# Patient Record
Sex: Female | Born: 1935 | Race: White | Hispanic: No | Smoking: Former smoker
Health system: Southern US, Community
[De-identification: ages and names within clinical notes are randomized; demographics above are authoritative.]

## PROBLEM LIST (undated history)

## (undated) DIAGNOSIS — I639 Cerebral infarction, unspecified: Secondary | ICD-10-CM

## (undated) DIAGNOSIS — I447 Left bundle-branch block, unspecified: Secondary | ICD-10-CM

## (undated) DIAGNOSIS — C801 Malignant (primary) neoplasm, unspecified: Secondary | ICD-10-CM

## (undated) DIAGNOSIS — I10 Essential (primary) hypertension: Secondary | ICD-10-CM

## (undated) DIAGNOSIS — E7800 Pure hypercholesterolemia, unspecified: Secondary | ICD-10-CM

## (undated) DIAGNOSIS — H9313 Tinnitus, bilateral: Secondary | ICD-10-CM

## (undated) DIAGNOSIS — M1990 Unspecified osteoarthritis, unspecified site: Secondary | ICD-10-CM

## (undated) DIAGNOSIS — R059 Cough, unspecified: Secondary | ICD-10-CM

## (undated) DIAGNOSIS — E079 Disorder of thyroid, unspecified: Secondary | ICD-10-CM

## (undated) DIAGNOSIS — H409 Unspecified glaucoma: Secondary | ICD-10-CM

## (undated) DIAGNOSIS — R05 Cough: Secondary | ICD-10-CM

## (undated) HISTORY — PX: BREAST SURGERY: SHX581

## (undated) HISTORY — PX: THYROIDECTOMY: SHX17

## (undated) HISTORY — PX: INTRACAPSULAR CATARACT EXTRACTION: SHX361

## (undated) HISTORY — PX: TUBAL LIGATION: SHX77

## (undated) HISTORY — PX: CATARACT EXTRACTION W/ INTRAOCULAR LENS IMPLANT: SHX1309

## (undated) HISTORY — PX: EYE SURGERY: SHX253

## (undated) HISTORY — PX: BREAST ENHANCEMENT SURGERY: SHX7

## (undated) HISTORY — DX: Disorder of thyroid, unspecified: E07.9

## (undated) HISTORY — DX: Essential (primary) hypertension: I10

## (undated) HISTORY — PX: APPENDECTOMY: SHX54

---
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
## 1968-01-06 HISTORY — PX: ABDOMINAL HYSTERECTOMY: SHX81

## 1997-11-07 ENCOUNTER — Other Ambulatory Visit: Admission: RE | Admit: 1997-11-07 | Discharge: 1997-11-07 | Payer: Self-pay | Admitting: Gynecology

## 2002-03-13 ENCOUNTER — Other Ambulatory Visit: Admission: RE | Admit: 2002-03-13 | Discharge: 2002-03-13 | Payer: Self-pay | Admitting: Gynecology

## 2003-10-04 ENCOUNTER — Other Ambulatory Visit: Admission: RE | Admit: 2003-10-04 | Discharge: 2003-10-04 | Payer: Self-pay | Admitting: Gynecology

## 2004-10-23 ENCOUNTER — Other Ambulatory Visit: Admission: RE | Admit: 2004-10-23 | Discharge: 2004-10-23 | Payer: Self-pay | Admitting: Gynecology

## 2006-10-27 ENCOUNTER — Emergency Department (HOSPITAL_COMMUNITY): Admission: EM | Admit: 2006-10-27 | Discharge: 2006-10-27 | Payer: Self-pay | Admitting: Family Medicine

## 2008-12-25 ENCOUNTER — Ambulatory Visit (HOSPITAL_BASED_OUTPATIENT_CLINIC_OR_DEPARTMENT_OTHER): Admission: RE | Admit: 2008-12-25 | Discharge: 2008-12-25 | Payer: Self-pay | Admitting: Orthopedic Surgery

## 2009-01-05 HISTORY — PX: OTHER SURGICAL HISTORY: SHX169

## 2009-05-27 ENCOUNTER — Encounter: Payer: Self-pay | Admitting: Orthopedic Surgery

## 2009-06-06 ENCOUNTER — Ambulatory Visit (HOSPITAL_COMMUNITY): Admission: RE | Admit: 2009-06-06 | Discharge: 2009-06-06 | Payer: Self-pay | Admitting: Orthopedic Surgery

## 2009-06-06 ENCOUNTER — Ambulatory Visit: Payer: Self-pay | Admitting: Orthopedic Surgery

## 2009-06-06 DIAGNOSIS — M479 Spondylosis, unspecified: Secondary | ICD-10-CM | POA: Insufficient documentation

## 2009-06-06 DIAGNOSIS — M1610 Unilateral primary osteoarthritis, unspecified hip: Secondary | ICD-10-CM | POA: Insufficient documentation

## 2009-06-06 DIAGNOSIS — Q762 Congenital spondylolisthesis: Secondary | ICD-10-CM | POA: Insufficient documentation

## 2010-02-03 ENCOUNTER — Ambulatory Visit: Admit: 2010-02-03 | Payer: Self-pay | Admitting: Vascular Surgery

## 2010-02-03 ENCOUNTER — Ambulatory Visit
Admission: RE | Admit: 2010-02-03 | Discharge: 2010-02-03 | Payer: Self-pay | Source: Home / Self Care | Attending: Vascular Surgery | Admitting: Vascular Surgery

## 2010-02-03 NOTE — Consult Note (Signed)
NEW PATIENT CONSULTATION  Alyssa [name], [name] DOB:  04-17-1927                                       02/03/2010 [id number]#:[id number]  Patient is an 77 year old female patient with a long history of venous insufficiency in both lower extremities, left worse  than the right. She has been having increasing burning, aching, and throbbing discomfort in both legs, left worse than right, over the last several months.  She was prescribed long-leg 30-40 mm elastic compression stockings which have not helped her symptomatology.  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  She has no history of thrombophlebitis, deep vein thrombosis, stasis ulcers or bleeding.  Does have some swelling as the day progresses.  She also has been found to have a limb length discrepancy with the left leg longer than the right.  CHRONIC MEDICAL PROBLEMS: 1. Hypertension. 2. History of thyroid cysts, previous thyroidectomy. 3. Cataracts. 4. Negative coronary artery disease, diabetes, COPD, or stroke.  SOCIAL HISTORY:  She is widowed and has 6 children and has not smoked in 40 years.  Does not use alcohol.  FAMILY HISTORY:  Negative coronary artery disease, diabetes, and stroke.  REVIEW OF SYSTEMS:  Positive for occasional productive cough, arthritis in the left hip, possibly due to her left limb length discrepancy.  All other systems are negative in complete review of systems.  PHYSICAL EXAMINATION:  High blood pressure is 164/82, heart rate 80, respirations 20.  General:  She is a well-developed and well-nourished female in no apparent distress, alert and oriented x3.  HEENT:  Normal for age.  EOMs intact.  Lungs:  Clear to auscultation.  No rhonchi or wheezing.  Cardiovascular:  Regular rhythm.  No murmurs.  Carotid pulses are 3+.  No audible bruits.  Abdomen:  Soft, nontender with no masses. Musculoskeletal:   Reveals the left thigh to be slightly longer than the right on general inspection.  Otherwise no abnormalities are noted. Neurologic:  Normal.  Skin is free of rashes.  Lower extremity exam reveals 3+ femoral, popliteal, and dorsalis pedis pulses palpable bilaterally.  She has bulging varicosities in the left leg beginning in the proximal thigh, extending anteriorly to the thigh, lateral to the knee, down into the pretibial area toward the ankle with diffuse reticular veins.  Right leg has a linear collection of bulging varicosities beginning in the upper third of the thigh, extending around the posterior thigh laterally and lateral to the knee, down into the lateral calf area.  There is no hyperpigmentation or ulceration noted.  Today I ordered lower extremity venous duplex exam which I reviewed and interpreted.  There is no DVT bilaterally.  Left leg has a great saphenous vein which revealed reflux at the junction and down throughout to the knee level.  On the right side, there is reflux at the junction extending down the great saphenous vein out through a lateral branch, which is small.  This patient is having symptomatic venous insufficiency, and I think we will treat her for another 4-6 weeks with long-leg elastic compression stockings (20 mm - 30 mm) which she has already worn for 2 months.  If her symptoms continue when she returns, I think the following procedures should be performed:  1.  A long laser ablation of  the left great saphenous veins, mid to proximal thigh to the junction.  2.  Multiple stab phlebectomies on the right side in the thigh and calf area.  3. Return in 3 months for probable stab phlebectomies of the left leg.  The patient will return in 4-6 weeks to complete her 92-month treatment. Conservative measures.    [name], M.D. Electronically Signed  JDL/MEDQ  D:  02/03/2010  T:  02/03/2010  Job:  [id number]

## 2010-02-06 NOTE — Medication Information (Signed)
Summary: RX Folder  RX Folder   Imported By: Cammie Sickle 06/11/2009 10:12:45  _____________________________________________________________________  External Attachment:    Type:   Image     Comment:   External Document

## 2010-02-06 NOTE — Letter (Signed)
Summary: Xray order  Xray order   Imported By: Cammie Sickle 06/06/2009 09:35:38  _____________________________________________________________________  External Attachment:    Type:   Image     Comment:   External Document

## 2010-02-06 NOTE — Letter (Signed)
Summary: History form  History form   Imported By: Jacklynn Ganong 06/20/2009 16:31:27  _____________________________________________________________________  External Attachment:    Type:   Image     Comment:   External Document

## 2010-02-06 NOTE — Assessment & Plan Note (Signed)
Summary: RT LEG LIMP/NEEDS XRAY/BCBS/CAF   Vital Signs:  Patient profile:   77 year old female Height:      63 inches Weight:      138 pounds Pulse rate:   76 / minute Resp:     16 per minute  Vitals Entered By: Fuller Canada MD (June 06, 2009 8:55 AM)  Visit Type:  new patient Referring Provider:  self Primary Provider:  Dr. Hughie Closs  CC:  limp.  History of Present Illness: I saw Alyssa [name] in the office today for an initial visit.  She is a 77 years old woman with the complaint of:  right leg is shorter than left leg.  SHE STARTED NOTICING THIS ABOUT 15 YEARS AGO. NOW SHE HAS A SIGNIFICANT LIMP. SHE ALSO NOTICED THAT SHE CANT CROSS HER LEGS.     Allergies (verified): 1)  ! 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 Respiratory:  Complains of couch and snoring; denies short of breath, wheezing, tightness, pain on inspiration, and snoring . Gastrointestinal:  Complains of constipation; denies heartburn, nausea, vomiting, diarrhea, and blood in your stools. Neurologic:  Complains of unsteady gait; denies numbness, tingling, dizziness, tremors, and seizure. Musculoskeletal:  Complains of stiffness; denies joint pain, swelling, instability, redness, heat, and muscle pain. Immunology:  Complains of seasonal allergies; denies sinus problems and allergic to bee stings.  The review of systems is negative for Cardiovascular, Genitourinary, Endocrine, Psychiatric, Skin, HEENT, and Hemoatologic.  Physical Exam  Msk:  The patient is well  developed and nourished, with normal grooming and hygiene. The body habitus is  small  Pulses:  The pulses and perfusion were normal with normal color, temperature  and no swelling   Extremities:   The upper extremities have normal appearance, ROM, strength and stability.  normal ROM, STRENGTH, AND STABILITY ALIGNMENT RIGHT LOWER EXTREMITY   LEFT HIP: ONLY 90 DEGREES OF FLEXION, 15 IR AND THERE WAS PAIN WITH IR   LEFT KNEE HELD IN FLEXION; BUT ITS NOT FIXED   MOTOR EXAM NORMAL AND ALIGNMENT IS NORMAL;  NO TENDERNESS AND NO INSTABILTY  Neurologic:  The coordination and sensation were normal  The reflexes were normal   Skin:  intact without lesions or rashes Inguinal Nodes:  no significant adenopathy Psych:  alert and cooperative; normal mood and affect; normal attention span and concentration   Impression & Recommendations:  Problem # 1:  ARTHRITIS, LEFT HIP (ICD-716.95) Assessment New  x-rays were done at [facility] AP lateral LEFT hip shows osteoarthritis LEFT hip  AP pelvis shows that the LEFT leg is about a centimeter longer than the RIGHT at least on the pelvic view.  There is not a significant amount of joint space narrowing there.  In the lumbosacral area there is spondylolisthesis at L4 and 5 and pelvic obliquity consistent with the block evaluation which was done showing a 1 inch leg length discrepancy LEFT longer than RIGHT  Orders: New Patient Level III (96295)  Problem # 2:  SPONDYLOLYSIS (MWU-132.44) Assessment: New  Orders: New Patient Level III (01027)  Problem # 3:  SPONDYLOSIS (ICD-721.90) Assessment: New  Orders: New Patient Level III (16109)  Problem # 4:  SPONDYLOLITHESIS (UEA-540.98) Assessment: New  Orders: New Patient Level III (11914)  Patient Instructions: 1)  3/4 INCH HEEL LIFT RIGHT  2)  Please schedule a follow-up appointment as needed.

## 2010-02-10 NOTE — Procedures (Unsigned)
LOWER EXTREMITY VENOUS REFLUX EXAM  INDICATION:  Evaluate for venous reflux.  EXAM:  Using color-flow imaging and pulse Doppler spectral analysis, the bilateral common femoral, superficial femoral, popliteal, posterior tibial, greater and lesser saphenous veins were evaluated.  There is evidence suggesting deep venous reflux of  >500 milliseconds noted in the bilateral lower extremities.  The bilateral saphenofemoral junctions and non-tortuous greater saphenous veins demonstrate reflux of >500 milliseconds.  The bilateral proximal short saphenous vein demonstrate competency.  GSV Diameter (used if found to be incompetent only)                                           Right    Left Proximal Greater Saphenous Vein           0.24 cm  0.61 cm Proximal-to-mid-thigh                     cm       cm Mid thigh                                 0.24 cm  0.35 cm Mid-distal thigh                          cm       cm Distal thigh                              0.23 cm  0.21 cm Knee                                      0.25 cm  0.20 cm  IMPRESSION: 1. Bilateral greater saphenous vein reflux is noted as described     above. 2. The bilateral deep venous system demonstrates reflux as described     above. 3. The bilateral short saphenous veins are competent.  ___________________________________________ Quita Skye. [name], M.D.  CH/MEDQ  D:  02/03/2010  T:  02/03/2010  Job:  [id number]

## 2010-03-03 ENCOUNTER — Ambulatory Visit: Payer: Self-pay | Admitting: Vascular Surgery

## 2010-03-04 ENCOUNTER — Ambulatory Visit (INDEPENDENT_AMBULATORY_CARE_PROVIDER_SITE_OTHER): Payer: Medicare Other | Admitting: Vascular Surgery

## 2010-03-04 DIAGNOSIS — I83893 Varicose veins of bilateral lower extremities with other complications: Secondary | ICD-10-CM

## 2010-03-05 NOTE — Assessment & Plan Note (Signed)
OFFICE VISIT  Alyssa [name], Alyssa [name] DOB:  1927/04/11                                       03/04/2010 [id number]#:[id number]  The patient  returns today for continued follow-up regarding her venous insufficiency in both lower extremities.  She has been shown to have reflux in the left great saphenous system from the saphenofemoral junction to the mid thigh which is supplying a long string of bulging varicosities along the anterior thigh lateral to the knee and into the lateral pretibial area.  She also has reflux of the lateral of the right great saphenous system which supplies multiple varicosities posteriorly in the thigh and in the right lateral calf.  She  has been wearing long- leg elastic compression stockings (20 mm - 30 mm gradient) as well as trying elevation and ibuprofen but continues to have some symptomatology which she was experiencing previously including burning and hot sensation in the legs , left worse than right, as well as aching and throbbing discomfort as the day progresses.  She has no history of DVT.  PHYSICAL EXAMINATION:  Today blood pressure is 143/75, heart rate 94, respirations 24.  The varicosities are in the distribution as noted above.  She has 2+ to 3+ dorsalis pedis pulse bilaterally.  I visualized her left great saphenous system today with the SonoSite and think she is a good candidate for laser ablation.  I think the best plan would be to: 1. Perform laser ablation of the left great saphenous vein from the     mid thigh to the SFJ. 2. Multiple stab phlebectomies in the right leg. 3. After 3 months consider stab phlebectomies in the left leg.  We will proceed with precertification to treat these symptoms which are affecting her daily living.    [name], M.D. Electronically Signed  JDL/MEDQ  D:  03/04/2010  T:  03/05/2010  Job:  [id number]

## 2010-04-14 ENCOUNTER — Other Ambulatory Visit (INDEPENDENT_AMBULATORY_CARE_PROVIDER_SITE_OTHER): Payer: Medicare Other | Admitting: Vascular Surgery

## 2010-04-14 DIAGNOSIS — I83893 Varicose veins of bilateral lower extremities with other complications: Secondary | ICD-10-CM

## 2010-04-14 HISTORY — PX: ENDOVENOUS ABLATION SAPHENOUS VEIN W/ LASER: SUR449

## 2010-04-15 NOTE — Assessment & Plan Note (Signed)
OFFICE VISIT  [name], [name] DOB:  Feb 22, 1927                                       04/14/2010 [id number]#:[id number]  The patient had laser ablation of her left great saphenous vein for painful varicosities in her left thigh and calf performed today under local tumescent anesthesia.  She tolerated the procedure well.  She will return in 1 week for venous duplex exam to confirm closure of the left great saphenous vein.  She will then be scheduled for multiple stab phlebectomies in the right leg and in 3 months will return for further evaluation to consider stab phlebectomies in the left leg.    [name], M.D. Electronically Signed  JDL/MEDQ  D:  04/14/2010  T:  04/15/2010  Job:  [id number]

## 2010-04-21 ENCOUNTER — Ambulatory Visit (INDEPENDENT_AMBULATORY_CARE_PROVIDER_SITE_OTHER): Payer: Medicare Other | Admitting: Vascular Surgery

## 2010-04-21 ENCOUNTER — Encounter (INDEPENDENT_AMBULATORY_CARE_PROVIDER_SITE_OTHER): Payer: Medicare Other

## 2010-04-21 DIAGNOSIS — I83893 Varicose veins of bilateral lower extremities with other complications: Secondary | ICD-10-CM

## 2010-04-21 DIAGNOSIS — Z48812 Encounter for surgical aftercare following surgery on the circulatory system: Secondary | ICD-10-CM

## 2010-04-21 DIAGNOSIS — R609 Edema, unspecified: Secondary | ICD-10-CM

## 2010-04-21 NOTE — Assessment & Plan Note (Signed)
OFFICE VISIT  [name], [name] DOB:  [birth year]                                       04/21/2010 [id number]#:[id number]  The patient returns 1 week post laser ablation of the left great saphenous vein for painful varicosities in the left posterior thigh and medial calf area.  She tolerated the procedure well 1 week ago and has had minimal discomfort along the course of the ablation site and states that she has already noted less tenseness in the bulging varicosities although they are still quite noticeable.  She has been wearing her long- leg elastic compression stockings (20 - 30 mm) and elevating her legs and taking ibuprofen as instructed.  She continues to have aching and throbbing discomfort in the right thigh and calf.  Today we performed a venous duplex exam of the left leg which reveals total closure of the left great saphenous vein from the mid thigh to the saphenofemoral junction with no DVT.  On exam she continues to have the varicosities in the posterior thigh and lateral calf on the left with 2 to 3+ dorsalis pedis pulse palpable. There is no distal edema and minimal tenderness along the course of the ablated saphenous vein.  On the right side the bulging varicosities are quite tense in the posterior thigh extending down lateral to the knee and into the lateral calf area.  I visualized the right great saphenous vein today and there is reflux at the junction but only a very short segment which communicates with these named varicosities described above.  The next procedure will be multiple stab phlebectomies of the bulging varicosities in the right leg to hopefully relieve her symptoms and then she will return in 3 months for further evaluation to complete treatment of the left leg with stab phlebectomies.    [name], M.D. Electronically Signed  JDL/MEDQ  D:  04/21/2010  T:  04/21/2010  Job:  [id number]

## 2010-04-24 NOTE — Procedures (Unsigned)
DUPLEX DEEP VENOUS EXAM - LOWER EXTREMITY  INDICATION:  Followup exam of the left lower extremity post endovenous laser ablation.  HISTORY:  Edema:  Yes. Trauma/Surgery:  Left EVLA on 04/14/2010. Pain:  Yes. PE:  No. Previous DVT:  No. Anticoagulants:  No. Other:  DUPLEX EXAM:               CFV   SFV   PopV   PTV   GSV               R  L  R  L  R  L   R  L  R  L Thrombosis    o  O     o     o      o     + Spontaneous   +  +     +     +      +     o Phasic        +  +     +     +      +     o Augmentation  +  +     +     +      +     o Compressible  +  +     +     +      +     o Competent     +  +     +     +      +  Legend:  + - yes  o - no  p - partial  D - decreased  IMPRESSION: 1. No evidence of deep venous thrombosis identified involving the left     lower extremity venous system. 2. Good post ablation result from the mid thigh to the saphenofemoral     junction in the left. 3. The remainder of the left greater saphenous vein remains     patent/open, with a large varicosity arising anterolaterally at the     mid thigh segment. 4. The contralateral common femoral vein is patent and without reflux     present.   _____________________________ Quita Skye. [name], M.D.  SH/MEDQ  D:  04/21/2010  T:  04/21/2010  Job:  [id number]

## 2010-06-03 ENCOUNTER — Encounter (INDEPENDENT_AMBULATORY_CARE_PROVIDER_SITE_OTHER): Payer: Medicare Other | Admitting: Vascular Surgery

## 2010-06-03 DIAGNOSIS — I83893 Varicose veins of bilateral lower extremities with other complications: Secondary | ICD-10-CM

## 2010-06-03 HISTORY — PX: OTHER SURGICAL HISTORY: SHX169

## 2010-06-04 NOTE — Assessment & Plan Note (Signed)
OFFICE VISIT  Alyssa [name], Alyssa [name] DOB:  July 30, 1927                                       06/03/2010 [id number]#:[id number]  Patient had greater than 20 stab phlebectomies in the right posterior medial thigh and posterior and lateral calf under local tumescent anesthesia.  She tolerated the procedure well.  She will return in 6-8 weeks for follow-up, at which time we will assess her contralateral extremity for potential stab phlebectomies.    [name], M.D. Electronically Signed  JDL/MEDQ  D:  06/03/2010  T:  06/04/2010  Job:  [id number]

## 2010-06-09 ENCOUNTER — Encounter: Payer: Medicare Other | Admitting: Vascular Surgery

## 2010-07-17 ENCOUNTER — Encounter: Payer: Self-pay | Admitting: Vascular Surgery

## 2010-07-24 ENCOUNTER — Encounter: Payer: Self-pay | Admitting: Vascular Surgery

## 2010-07-28 ENCOUNTER — Ambulatory Visit: Payer: Medicare Other | Admitting: Vascular Surgery

## 2010-07-29 ENCOUNTER — Ambulatory Visit: Payer: Medicare Other | Admitting: Vascular Surgery

## 2010-08-05 ENCOUNTER — Ambulatory Visit (INDEPENDENT_AMBULATORY_CARE_PROVIDER_SITE_OTHER): Payer: Medicare Other | Admitting: Vascular Surgery

## 2010-08-05 VITALS — BP 169/80 | HR 81 | Resp 20 | Ht 62.0 in | Wt 136.0 lb

## 2010-08-05 DIAGNOSIS — I83893 Varicose veins of bilateral lower extremities with other complications: Secondary | ICD-10-CM

## 2010-08-05 NOTE — Progress Notes (Signed)
Subjective:     Patient ID: Alyssa [name], female   DOB: 27-May-1927, 77 y.o.   MRN: [id number]  HPI this patient returns for initial followup regarding her laser ablation of the left great saphenous vein in the proximal thigh or valvular incompetence and venous hypertension. She has bulging varicosities beginning in the mid thigh extending down lateral to the end of the lateral calf area. These are much less tense than they were prior to her laser ablation procedure but continued to be symptomatic with aching throbbing and burning discomfort. This has not resolved even wearing long leg he last ate compression stockings. She has no distal edema but does have significant symptomatology during the day relating to these residual varicosities in the left leg. She has no symptoms of the right leg following her stab phlebectomy procedure the right posterior calf on May 29   Review of Systems she denies chest pain dyspnea on exertion PND orthopnea or other generalized symptomatology.     Objective:   Physical Exam on physical exam today blood pressure 169/80 heart rate 81 respirations 20 Gen. he is well-developed well-nourished female no apparent distress alert and oriented x3. Chest clear to auscultation no rhonchi or wheezing Lower extremity exam reveals 3+ femoral popliteal and dorsalis pedis pulses palpable bilaterally. There are bulging varicosities in the left leg beginning in mid anterior thigh extending lateral to the knee and down almost to the left ankle. There is no hyperpigmentation or active ulceration. She also has scattered reticular and spider veins.    Assessment:    doing well post laser ablation of the left great saphenous vein but does have residual symptomatic bulging varicosities in the left leg.    Plan:    patient needs stab phlebectomy is (greater than 20 and apprentices in the left leg of her secondary varicosities. Will proceed with precertification to perform this in the  near future

## 2010-08-05 NOTE — Progress Notes (Signed)
Three month fu after laser for stab phlebs. Of left leg.

## 2010-08-14 ENCOUNTER — Encounter: Payer: Self-pay | Admitting: Vascular Surgery

## 2010-08-25 ENCOUNTER — Encounter: Payer: Self-pay | Admitting: Vascular Surgery

## 2010-08-25 ENCOUNTER — Ambulatory Visit (INDEPENDENT_AMBULATORY_CARE_PROVIDER_SITE_OTHER): Payer: Medicare Other | Admitting: Vascular Surgery

## 2010-08-25 VITALS — BP 176/74 | HR 79 | Resp 18 | Ht 62.0 in | Wt 145.0 lb

## 2010-08-25 DIAGNOSIS — I83893 Varicose veins of bilateral lower extremities with other complications: Secondary | ICD-10-CM

## 2010-08-26 ENCOUNTER — Telehealth: Payer: Self-pay | Admitting: *Deleted

## 2010-08-26 NOTE — Assessment & Plan Note (Signed)
OFFICE VISIT  [name], [name] DOB:  March 21, 1927                                       08/25/2010 [id number]#:[id number]  The patient had greater than 20 stab phlebectomies of her left anterior thigh, lateral thigh and lateral calf area.  She had previously undergone laser ablation of her left great saphenous vein in April of this year for valvular incompetence and reflux.  The procedure today was under local tumescent anesthesia and she tolerated it well.  She will return in 2 months for final followup.    [name], M.D. Electronically Signed  JDL/MEDQ  D:  08/25/2010  T:  08/26/2010  Job:  [id number]

## 2010-08-26 NOTE — Telephone Encounter (Signed)
Pt. Doing well post stab phleb. procedure yesterday. No probs. Plans to return to work tomorrow. Following all instructions that were given.

## 2010-10-27 ENCOUNTER — Encounter: Payer: Self-pay | Admitting: Vascular Surgery

## 2010-10-27 ENCOUNTER — Ambulatory Visit (INDEPENDENT_AMBULATORY_CARE_PROVIDER_SITE_OTHER): Payer: Medicare Other | Admitting: Vascular Surgery

## 2010-10-27 VITALS — BP 140/68 | HR 79 | Resp 16 | Ht 62.0 in | Wt 135.4 lb

## 2010-10-27 DIAGNOSIS — I83893 Varicose veins of bilateral lower extremities with other complications: Secondary | ICD-10-CM

## 2010-10-27 NOTE — Progress Notes (Signed)
Subjective:     Patient ID: Alyssa [name], female   DOB: Oct 10, 1927, 77 y.o.   MRN: [id number]  HPI this 77 year old female returns for final followup regarding her venous treatment. She had laser ablation of the left great saphenous vein performed on 04/14/2010 appear she also had stab phlebectomy performed right lower extremity 06/03/2010 and left lower extremity on 08/25/2010. She has done well following both procedures. She has no distal edema. She has had no aching or throbbing discomfort since her most recent procedures. She no longer needs compression stockings.  Review of Systems     Objective:   Physical Exam blood pressure 140/68 heart rate 79 respirations 16 Lower extremity exam reveals 3+ femoral and dorsalis pedis pulses palpable bilaterally. She has notable varicosities remaining in either lower extremity. All stab phlebectomy wounds have healed nicely. There is no distal edema. She has diffuse spider and reticular veins in the anterolateral thighs bilaterally.     Assessment:    doing well post stab phlebectomy both lower extremities now asymptomatic    Plan:    return on when necessary basis

## 2011-03-23 ENCOUNTER — Emergency Department (HOSPITAL_COMMUNITY): Payer: Medicare Other

## 2011-03-23 ENCOUNTER — Other Ambulatory Visit: Payer: Self-pay

## 2011-03-23 ENCOUNTER — Observation Stay (HOSPITAL_COMMUNITY)
Admission: EM | Admit: 2011-03-23 | Discharge: 2011-03-24 | Disposition: A | Discharge status: Home / Self Care | Payer: Medicare Other | Attending: Internal Medicine | Admitting: Internal Medicine

## 2011-03-23 ENCOUNTER — Encounter (HOSPITAL_COMMUNITY): Payer: Self-pay

## 2011-03-23 DIAGNOSIS — M25519 Pain in unspecified shoulder: Principal | ICD-10-CM | POA: Insufficient documentation

## 2011-03-23 DIAGNOSIS — I639 Cerebral infarction, unspecified: Secondary | ICD-10-CM

## 2011-03-23 DIAGNOSIS — I10 Essential (primary) hypertension: Secondary | ICD-10-CM | POA: Diagnosis present

## 2011-03-23 DIAGNOSIS — R0789 Other chest pain: Secondary | ICD-10-CM | POA: Insufficient documentation

## 2011-03-23 DIAGNOSIS — E785 Hyperlipidemia, unspecified: Secondary | ICD-10-CM | POA: Insufficient documentation

## 2011-03-23 DIAGNOSIS — R079 Chest pain, unspecified: Secondary | ICD-10-CM | POA: Diagnosis present

## 2011-03-23 DIAGNOSIS — M25512 Pain in left shoulder: Secondary | ICD-10-CM | POA: Diagnosis present

## 2011-03-23 HISTORY — DX: Left bundle-branch block, unspecified: I44.7

## 2011-03-23 HISTORY — DX: Pure hypercholesterolemia, unspecified: E78.00

## 2011-03-23 HISTORY — DX: Unspecified osteoarthritis, unspecified site: M19.90

## 2011-03-23 HISTORY — DX: Malignant (primary) neoplasm, unspecified: C80.1

## 2011-03-23 HISTORY — DX: Cerebral infarction, unspecified: I63.9

## 2011-03-23 HISTORY — DX: Unspecified glaucoma: H40.9

## 2011-03-23 LAB — CREATININE, SERUM
Creatinine, Ser: 0.66 mg/dL (ref 0.50–1.10)
GFR calc Af Amer: >90 mL/min (ref >90)

## 2011-03-23 LAB — DIFFERENTIAL
Basophils Relative: 1 % (ref 0–1)
Monocytes Absolute: 0.7 10*3/uL (ref 0.1–1.0)
Neutro Abs: 5.5 10*3/uL (ref 1.7–7.7)

## 2011-03-23 LAB — CBC
Hemoglobin: 12.3 g/dL (ref 12.0–15.0)
MCH: 27.3 pg (ref 26.0–34.0)
MCH: 27.6 pg (ref 26.0–34.0)
Platelets: 223 10*3/uL (ref 150–400)
Platelets: 242 10*3/uL (ref 150–400)
RBC: 4.51 MIL/uL (ref 3.87–5.11)
RBC: 4.63 MIL/uL (ref 3.87–5.11)
WBC: 9 10*3/uL (ref 4.0–10.5)

## 2011-03-23 LAB — TSH: TSH: 2.292 u[IU]/mL (ref 0.350–4.500)

## 2011-03-23 LAB — POCT I-STAT, CHEM 8
Calcium, Ion: 1.12 mmol/L (ref 1.12–1.32)
Chloride: 100 mEq/L (ref 96–112)
Glucose, Bld: 101 mg/dL — ABNORMAL HIGH (ref 70–99)
HCT: 38 % (ref 36.0–46.0)
Hemoglobin: 12.9 g/dL (ref 12.0–15.0)
Potassium: 4.1 mEq/L (ref 3.5–5.1)
TCO2: 25 mmol/L (ref 0–100)

## 2011-03-23 LAB — TROPONIN I: Troponin I: <0.3 ng/mL (ref <0.30)

## 2011-03-23 LAB — URINE MICROSCOPIC-ADD ON

## 2011-03-23 LAB — URINALYSIS, ROUTINE W REFLEX MICROSCOPIC
Glucose, UA: NEGATIVE mg/dL
Nitrite: NEGATIVE
Protein, ur: NEGATIVE mg/dL
Specific Gravity, Urine: 1.004 — ABNORMAL LOW (ref 1.005–1.030)
Urobilinogen, UA: 0.2 mg/dL (ref 0.0–1.0)
pH: 7.5 (ref 5.0–8.0)

## 2011-03-23 LAB — CARDIAC PANEL(CRET KIN+CKTOT+MB+TROPI): Total CK: 82 U/L (ref 7–177)

## 2011-03-23 MED ORDER — ASPIRIN EC 81 MG PO TBEC: 81.0000 mg | DELAYED_RELEASE_TABLET | Freq: Every day | ORAL | Status: DC

## 2011-03-23 MED ORDER — ONDANSETRON HCL 4 MG PO TABS: 4.0000 mg | ORAL_TABLET | Freq: Four times a day (QID) | ORAL | Status: DC | PRN

## 2011-03-23 MED ORDER — ACETAMINOPHEN 325 MG PO TABS: 650.0000 mg | ORAL_TABLET | Freq: Four times a day (QID) | ORAL | Status: DC | PRN

## 2011-03-23 MED ORDER — ONDANSETRON HCL 4 MG/2ML IJ SOLN: 4.0000 mg | Freq: Four times a day (QID) | INTRAMUSCULAR | Status: DC | PRN

## 2011-03-23 MED ORDER — DOCUSATE SODIUM 100 MG PO CAPS
100.0000 mg | ORAL_CAPSULE | Freq: Two times a day (BID) | ORAL | Status: DC
  Administered 2011-03-23 – 2011-03-24 (×2): 100 mg via ORAL
  Filled 2011-03-23 (×3): qty 1

## 2011-03-23 MED ORDER — IRBESARTAN 75 MG PO TABS: 75.0000 mg | ORAL_TABLET | Freq: Every day | ORAL | Status: DC

## 2011-03-23 MED ORDER — ASPIRIN EC 81 MG PO TBEC
81.0000 mg | DELAYED_RELEASE_TABLET | Freq: Every day | ORAL | Status: DC
  Administered 2011-03-24: 81 mg via ORAL
  Filled 2011-03-23: qty 1

## 2011-03-23 MED ORDER — ACETAMINOPHEN 650 MG RE SUPP: 650.0000 mg | Freq: Four times a day (QID) | RECTAL | Status: DC | PRN

## 2011-03-23 MED ORDER — SODIUM CHLORIDE 0.9 % IJ SOLN: 3.0000 mL | Freq: Two times a day (BID) | INTRAMUSCULAR | Status: DC

## 2011-03-23 MED ORDER — SODIUM CHLORIDE 0.9 % IV SOLN
INTRAVENOUS | Status: DC
  Administered 2011-03-23 (×2): via INTRAVENOUS

## 2011-03-23 MED ORDER — IRBESARTAN 75 MG PO TABS
75.0000 mg | ORAL_TABLET | Freq: Every day | ORAL | Status: DC
  Administered 2011-03-24: 75 mg via ORAL
  Filled 2011-03-23: qty 1

## 2011-03-23 MED ORDER — ALUM & MAG HYDROXIDE-SIMETH 200-200-20 MG/5ML PO SUSP: 30.0000 mL | Freq: Four times a day (QID) | ORAL | Status: DC | PRN

## 2011-03-23 MED ORDER — ENOXAPARIN SODIUM 40 MG/0.4ML ~~LOC~~ SOLN: 40.0000 mg | SUBCUTANEOUS | Status: DC

## 2011-03-23 MED ORDER — ENOXAPARIN SODIUM 40 MG/0.4ML ~~LOC~~ SOLN
40.0000 mg | SUBCUTANEOUS | Status: DC
  Filled 2011-03-23 (×2): qty 0.4

## 2011-03-23 MED ORDER — SODIUM CHLORIDE 0.9 % IJ SOLN: 3.0000 mL | INTRAMUSCULAR | Status: DC | PRN

## 2011-03-23 MED ORDER — SODIUM CHLORIDE 0.9 % IV SOLN: 250.0000 mL | INTRAVENOUS | Status: DC | PRN

## 2011-03-23 MED ORDER — POLYETHYLENE GLYCOL 3350 17 G PO PACK
17.0000 g | PACK | Freq: Every day | ORAL | Status: DC | PRN
  Filled 2011-03-23: qty 1

## 2011-03-23 MED ORDER — HYDROCODONE-ACETAMINOPHEN 5-325 MG PO TABS: 1.0000 | ORAL_TABLET | ORAL | Status: DC | PRN

## 2011-03-23 MED ORDER — ZOLPIDEM TARTRATE 5 MG PO TABS: 5.0000 mg | ORAL_TABLET | Freq: Every evening | ORAL | Status: DC | PRN

## 2011-03-23 MED ORDER — SODIUM CHLORIDE 0.9 % IJ SOLN
3.0000 mL | Freq: Two times a day (BID) | INTRAMUSCULAR | Status: DC
  Administered 2011-03-23 – 2011-03-24 (×2): 3 mL via INTRAVENOUS

## 2011-03-23 NOTE — H&P (Signed)
Patient seen and examined. Vague left upper chest pain and left upper arm pain that completely resolved. Apparently BP check at home was systolic 200, therefore presented to the ED for further evaluation. Given elevated BP and advanced age-patient will be brought is as overnight observation and enzymes will be cycled. Will monitor and follow clinical course. Agree with assessment and plan as outlined by Ms [place].

## 2011-03-23 NOTE — ED Notes (Signed)
MD at bedside. 

## 2011-03-23 NOTE — ED Provider Notes (Signed)
History     CSN: [id number]  Arrival date & time 03/23/11  1031   First MD Initiated Contact with Patient 03/23/11 1102      Chief Complaint  Patient presents with  . Chest Pain    (Consider location/radiation/quality/duration/timing/severity/associated sxs/prior treatment) Patient is a 78 y.o. female presenting with chest pain. The history is provided by the patient.  Chest Pain Pertinent negatives for primary symptoms include no shortness of breath, no abdominal pain, no nausea and no vomiting.  Pertinent negatives for associated symptoms include no numbness and no weakness.    patient states that she had left shoulder pain going on or last night. She's had no chest pain. He states he was off the shoulder more last night. She states she's checked her blood pressure is high. She took a couple her blood pressure pills it came down. She states that after aspirin and a blood pressure pill she felt better. She did not do anything physically with her first ER. The pain was not worse with moving the arm. No trauma. No shortness of breath. No cough. She has a history of hypertension.  Past Medical History  Diagnosis Date  . Cataract   . Thyroid disease   . Hypertension     Past Surgical History  Procedure Date  . Appendectomy   . Eye surgery   . Abdominal hysterectomy   . Laser ablation l gsv 04/14/10  . Stab phlebectomies  06/03/10    R leg    History reviewed. No pertinent family history.  History  Substance Use Topics  . Smoking status: Former Smoker    Quit date: 01/05/1970  . Smokeless tobacco: Never Used  . Alcohol Use: No    OB History    Grav Para Term Preterm Abortions TAB SAB Ect Mult Living                  Review of Systems  Constitutional: Negative for activity change and appetite change.  HENT: Negative for neck stiffness.   Eyes: Negative for pain.  Respiratory: Negative for chest tightness and shortness of breath.   Cardiovascular: Negative for chest  pain and leg swelling.       Left shoulder pain  Gastrointestinal: Negative for nausea, vomiting, abdominal pain and diarrhea.  Genitourinary: Negative for flank pain.  Musculoskeletal: Negative for back pain.  Skin: Negative for rash.  Neurological: Negative for weakness, numbness and headaches.  Psychiatric/Behavioral: Negative for behavioral problems.    Allergies  Penicillins  Home Medications  No current outpatient prescriptions on file.  BP 119/70  Pulse 84  Temp(Src) 97.5 F (36.4 C) (Oral)  Resp 19  SpO2 96%  Physical Exam  Nursing note and vitals reviewed. Constitutional: She is oriented to person, place, and time. She appears well-developed and well-nourished.  HENT:  Head: Normocephalic and atraumatic.  Eyes: EOM are normal. Pupils are equal, round, and reactive to light.  Neck: Normal range of motion. Neck supple.  Cardiovascular: Normal rate, regular rhythm and normal heart sounds.   No murmur heard. Pulmonary/Chest: Effort normal and breath sounds normal. No respiratory distress. She has no wheezes. She has no rales.  Abdominal: Soft. Bowel sounds are normal. She exhibits no distension. There is no tenderness. There is no rebound and no guarding.  Musculoskeletal: Normal range of motion.       No tenderness with palpation of the left shoulder. Range of motion intact left shoulder without pain.  Neurological: She is alert and oriented to  person, place, and time. No cranial nerve deficit.  Skin: Skin is warm and dry.  Psychiatric: She has a normal mood and affect. Her speech is normal.    ED Course  Procedures (including critical care time)  Labs Reviewed  URINALYSIS, ROUTINE W REFLEX MICROSCOPIC - Abnormal; Notable for the following:    Specific Gravity, Urine 1.004 (*)    Hgb urine dipstick TRACE (*)    All other components within normal limits  POCT I-STAT, CHEM 8 - Abnormal; Notable for the following:    Glucose, Bld 101 (*)    All other components  within normal limits  CBC  DIFFERENTIAL  TROPONIN I  URINE MICROSCOPIC-ADD ON   Dg Chest 2 View  03/23/2011  *RADIOLOGY REPORT*  Clinical Data: Chest pain  CHEST - 2 VIEW  Comparison: None.  Findings: Hyperinflation.  Mild osteopenia.  Bilateral breast implants. Midline trachea.  Normal heart size and mediastinal contours for age.  No pleural effusion or pneumothorax.  Biapical pleural thickening.  Mild interstitial thickening, lower lobe predominant.  Nonspecific.  Probable mild scarring at the left lung base and possibly the right midlung (versus overlying implants artifact/calcification).  IMPRESSION: Mild hyperinflation, without acute disease.  Original Report Authenticated By: Consuello Bossier, M.D.     1. Shoulder pain      Date: 03/23/2011  Rate: 111  Rhythm: sinus tachycardia  QRS Axis: left  Intervals: normal  ST/T Wave abnormalities: normal  Conduction Disutrbances:left bundle branch block  Narrative Interpretation: More tachycardic than previous.  Old EKG Reviewed: changes noted    MDM  Patient with left shoulder and upper extremity pain. No trauma. Not worse with movement. She states it began while blood pressure was high. EKG is stable except for mild tachycardia. She'll need to be admitted for cardiac rule out        Juliet Rude. Rubin Payor, MD 03/23/11 1419

## 2011-03-23 NOTE — ED Notes (Signed)
Pt c/o left shoulder and arm pain last night and beginning again this am. Denies cp at this time but states that it was tight on left upper side last night. States she had checked her BP and it was high and she took a BP pill and felt better. Pt then states she felt it again this am and her arm and shoulder hurt again and saw BP was high again, took an asa and a BP pill and is feeling better now.

## 2011-03-23 NOTE — ED Notes (Signed)
Consulting MD at bedside

## 2011-03-23 NOTE — H&P (Signed)
Alyssa Melena, MD, MD   Chief Complaint:  Left Shoulder Tingling.  Chest Pain rule out.  HPI: Alyssa [name] is a very active healthy 78 year old female who takes no medications at home and has very little past medical history.  She was her chair at watching TV when she developed left shoulder tingling that extended down to her elbow. She became concerned and called her daughter on the telephone. Her daughter was concern for possible heart attack and brought her mother to the emergency department. In the meantime her mother had taken one aspirin and 1 Benicar. She did not take the Benicar regularly but had obtained samples from her primary care physician 3 years ago. By the time Alyssa [name] was in the emergency department her left shoulder tingling/pain had completely resolved. Alyssa [name] has noticed no shortness of breath on exertion, no orthopnea or PND. She has had no chest pain and has had no left shoulder tingling previously. She is normally very active, eats very well and has had no recent illness.   Review of Systems:  The patient denies anorexia, fever, weight loss,, vision loss, decreased hearing, hoarseness, chest pain, syncope, dyspnea on exertion, peripheral edema, balance deficits, hemoptysis, abdominal pain, [name], hematochezia, severe indigestion/heartburn, hematuria, incontinence, genital sores, muscle weakness, suspicious skin lesions, transient blindness, difficulty walking, depression, unusual weight change, abnormal bleeding, enlarged lymph nodes, angioedema, and breast masses.  Past Medical History: Past Medical History  Diagnosis Date  . Cataract   . Thyroid disease   . Hypertension    Past Surgical History  Procedure Date  . Appendectomy   . Eye surgery   . Abdominal hysterectomy   . Laser ablation l gsv 04/14/10  . Stab phlebectomies  06/03/10    R leg  * Breast Implants    Medications: Prior to Admission medications   Not on File    Allergies:   Allergies    Allergen Reactions  . Penicillins Other (See Comments)    Swelling     Social History:  reports that she quit smoking about 41 years ago. She has never used smokeless tobacco. She reports that she does not drink alcohol. Her drug history not on file.  History   Social History Narrative  . No narrative on file     Family History: Mother passed at age 57.  Phys and the rest of his ical Exam: Filed Vitals:   03/23/11 1330 03/23/11 1400 03/23/11 1430 03/23/11 1500  BP: 123/66 116/68 164/78 129/71  Pulse: 78 82 86 82  Temp:      TempSrc:      Resp: 19 24 20 17   SpO2: 95% 94% 94% 95%   Physical exam Gen.: Patient is alert and oriented she's in no apparent distress she is very pleasant appears stated age Head: is atraumatic normocephalic, eyes are anicteric with pupils that are equal round reactive to light  Neck: She has no carotid bruits on auscultation, no palpable lymph nodes Chest: Clear to auscultation with no wheezes crackles or rales Cardiovascular: Patient is slightly tachycardic with a regular rhythm no murmurs rubs or gallops Abdomen: Soft, nontender, nondistended, with good bowel sounds. Extremities: Show no clubbing, cyanosis, or edema. Peripheral pulses are intact. Left shoulder is nontender to palpation, nor is it tender with full range of motion .    Labs on Admission:   [facility] 03/23/11 1123  NA 137  K 4.1  CL 100  CO2 --  GLUCOSE 101*  BUN 13  CREATININE 0.60  CALCIUM --  MG --  PHOS --    Basename 03/23/11 1123 03/23/11 1110  WBC -- 7.9  NEUTROABS -- 5.5  HGB 12.9 12.3  HCT 38.0 37.0  MCV -- 82.0  PLT -- 223    Basename 03/23/11 1111  CKTOTAL --  CKMB --  CKMBINDEX --  TROPONINI <0.30    Radiological Exams on Admission: Dg Chest 2 View  03/23/2011  *RADIOLOGY REPORT*  Clinical Data: Chest pain  CHEST - 2 VIEW  Comparison: None.  Findings: Hyperinflation.  Mild osteopenia.  Bilateral breast implants. Midline trachea.  Normal heart  size and mediastinal contours for age.  No pleural effusion or pneumothorax.  Biapical pleural thickening.  Mild interstitial thickening, lower lobe predominant.  Nonspecific.  Probable mild scarring at the left lung base and possibly the right midlung (versus overlying implants artifact/calcification).  IMPRESSION: Mild hyperinflation, without acute disease.  Original Report Authenticated By: Consuello Bossier, M.D.    Assessment/Plan Present on Admission:  .Left shoulder pain .Chest pain at rest .Hypertension   1.  Left shoulder tingling - resolved.  Etiology uncertain.  Does not appear to be musculo-skeletal.  First set of cardiac enzymes is negative.  EKG shows LBBB that was on Dec 2010 EKG.  We will admit for observation and rule out ACS with cardiac enzymes X 3  2.  Chest pain at rest - will check 2D echo, lipid panel, TSH, start 81 mg aspirin  3.  Hypertension - monitor in house and start low dose Benicar (10 mg q day)  Stephani Police 03/23/2011, 3:05 PM

## 2011-03-23 NOTE — ED Notes (Signed)
Pt transported to xray 

## 2011-03-23 NOTE — ED Notes (Signed)
Here for left arm pain and chest pain and htn.

## 2011-03-24 LAB — CARDIAC PANEL(CRET KIN+CKTOT+MB+TROPI)
CK, MB: 3.2 ng/mL (ref 0.3–4.0)
Relative Index: INVALID (ref 0.0–2.5)
Total CK: 72 U/L (ref 7–177)
Total CK: 73 U/L (ref 7–177)

## 2011-03-24 LAB — HEMOGLOBIN A1C: Mean Plasma Glucose: 120 mg/dL — ABNORMAL HIGH (ref <117)

## 2011-03-24 LAB — LIPID PANEL: HDL: 45 mg/dL (ref >39)

## 2011-03-24 LAB — TSH: TSH: 3.178 u[IU]/mL (ref 0.350–4.500)

## 2011-03-24 MED ORDER — SIMVASTATIN 10 MG PO TABS
10.0000 mg | ORAL_TABLET | Freq: Every day | ORAL | Status: DC
  Administered 2011-03-24: 10 mg via ORAL
  Filled 2011-03-24: qty 1

## 2011-03-24 MED ORDER — SIMVASTATIN 20 MG PO TABS
20.0000 mg | ORAL_TABLET | Freq: Every day | ORAL | Status: DC
  Filled 2011-03-24: qty 1

## 2011-03-24 MED ORDER — ASPIRIN 81 MG PO TBEC: 81.0000 mg | DELAYED_RELEASE_TABLET | Freq: Every day | ORAL | Status: AC

## 2011-03-24 MED ORDER — SIMVASTATIN 10 MG PO TABS: 10.0000 mg | ORAL_TABLET | Freq: Every day | ORAL | Status: AC

## 2011-03-24 MED ORDER — ACETAMINOPHEN 500 MG PO TABS: 500.0000 mg | ORAL_TABLET | Freq: Four times a day (QID) | ORAL | Status: AC | PRN

## 2011-03-24 NOTE — Discharge Summary (Signed)
Physician Discharge Summary  Patient ID: Alyssa [name] MRN: [id number] DOB/AGE: October 05, 1927 78 y.o.  Admit date: 03/23/2011 Discharge date: 03/24/2011  Primary Care Physician:  Dwana Melena, MD, MD  Discharge Diagnoses:    .Left shoulder pain .Chest pain  Atypical  .Hypertension Hyperlipidemia  Consults:  None  Discharge Medications: Medication List  As of 03/24/2011  4:49 PM   TAKE these medications         acetaminophen 500 MG tablet   Commonly known as: TYLENOL   Take 1 tablet (500 mg total) by mouth every 6 (six) hours as needed for pain.      aspirin 81 MG EC tablet   Take 1 tablet (81 mg total) by mouth daily.      simvastatin 10 MG tablet   Commonly known as: ZOCOR   Take 1 tablet (10 mg total) by mouth daily.             Brief H and P: For complete details please refer to admission H and P, but in brief, patient is a 78 year old female with no significant past medical history, was in her chair watching TV when she dropped left shoulder tingling that extended down to her elbow. Patient called her daughter who was concerned about possible heart attack and brought her mother to the ED. By the time patient was in the ED her left shoulder tingling/pain had completely resolved. Patient was admitted overnight for observation and rule out ACS.  Hospital Course:  Active Problems:  Left shoulder pain/ Chest pain: Patient was admitted to telemetry floor, serial cardiac enzymes were obtained which were negative for any ACS. Patient was started on aspirin. Lipid panel was obtained, which showed elevated cholesterol at 262 and LDL 190. Patient was started on low dose simvastatin. 2-D echocardiogram was obtained which showed EF of 55-60% with grade 1 diastolic dysfunction. Patient was discharged home in stable condition.    Hypertension: The patient had been taking off and on Benicar, however SBP was in 90s during hospitalization, patient was not discharged on any  antihypertensives   Day of Discharge BP 93/58  Pulse 81  Temp(Src) 98.4 F (36.9 C) (Oral)  Resp 18  Ht 5\' 2"  (1.575 m)  Wt 61.508 kg (135 lb 9.6 oz)  BMI 24.80 kg/m2  SpO2 95%  Physical Exam: General: Alert and awake oriented x3 not in any acute distress. HEENT: anicteric sclera, pupils reactive to light and accommodation CVS: S1-S2 clear no murmur rubs or gallops Chest: clear to auscultation bilaterally, no wheezing rales or rhonchi Abdomen: soft nontender, nondistended, normal bowel sounds, no organomegaly Extremities: no cyanosis, clubbing or edema noted bilaterally Neuro: Cranial nerves II-XII intact, no focal neurological deficits   The results of significant diagnostics from this hospitalization (including imaging, microbiology, ancillary and laboratory) are listed below for reference.    LAB RESULTS: Basic Metabolic Panel:  Lab 03/23/11 4098 03/23/11 1123  NA -- 137  K -- 4.1  CL -- 100  CO2 -- --  GLUCOSE -- 101*  BUN -- 13  CREATININE 0.66 0.60  CALCIUM -- --  MG -- --  PHOS -- --   CBC:  Lab 03/23/11 1718 03/23/11 1123 03/23/11 1110  WBC 9.0 -- 7.9  NEUTROABS -- -- 5.5  HGB 12.8 12.9 --  HCT 38.3 38.0 --  MCV 82.7 -- --  PLT 242 -- 223   Cardiac Enzymes:  Lab 03/24/11 0845 03/24/11 0017  CKTOTAL 73 72  CKMB 3.2 3.2  CKMBINDEX -- --  TROPONINI <0.30 <0.30    Significant Diagnostic Studies:  Dg Chest 2 View  03/23/2011  *RADIOLOGY REPORT*  Clinical Data: Chest pain  CHEST - 2 VIEW  Comparison: None.  Findings: Hyperinflation.  Mild osteopenia.  Bilateral breast implants. Midline trachea.  Normal heart size and mediastinal contours for age.  No pleural effusion or pneumothorax.  Biapical pleural thickening.  Mild interstitial thickening, lower lobe predominant.  Nonspecific.  Probable mild scarring at the left lung base and possibly the right midlung (versus overlying implants artifact/calcification).  IMPRESSION: Mild hyperinflation, without  acute disease.  Original Report Authenticated By: Consuello Bossier, M.D.     Disposition and Follow-up: Discharge Orders    Future Orders Please Complete By Expires   Diet - low sodium heart healthy      Increase activity slowly          DISPOSITION: Home  DIET: Heart healthy  ACTIVITY: As tolerated  DISCHARGE FOLLOW-UP Follow-up Information    Follow up with [facility], MD. Schedule an appointment as soon as possible for a visit in 2 weeks. (for hospital follow up)          Time spent on Discharge: 45 minutes  Signed:  Nimisha Rathel M.D. [facility] 03/24/2011, 4:49 PM

## 2011-03-24 NOTE — Progress Notes (Signed)
Pt was given d/c instructions and verbalized understanding. Pt handed prescriptions and educated on how to take meds. No questions at this time. VSS. No complaints. Will monitor until patient leaves unit. Lanae Boast, RN

## 2011-03-24 NOTE — Progress Notes (Signed)
*  PRELIMINARY RESULTS* Echocardiogram 2D Echocardiogram has been performed.  Alyssa [name] [facility] 03/24/2011, 10:52 AM

## 2011-03-25 MED FILL — Perflutren Lipid Microsphere IV Susp 6.52 MG/ML: INTRAVENOUS | Qty: 2 | Status: AC

## 2012-03-04 ENCOUNTER — Other Ambulatory Visit: Payer: Self-pay | Admitting: Orthopedic Surgery

## 2012-03-04 MED ORDER — BUPIVACAINE LIPOSOME 1.3 % IJ SUSP: 20.0000 mL | Freq: Once | INTRAMUSCULAR | Status: DC

## 2012-03-04 MED ORDER — DEXAMETHASONE SODIUM PHOSPHATE 10 MG/ML IJ SOLN: 10.0000 mg | Freq: Once | INTRAMUSCULAR | Status: DC

## 2012-03-04 NOTE — Progress Notes (Signed)
Preoperative surgical orders have been place into the Epic hospital system for Alyssa [name] on 03/04/2012, 2:14 PM  by Patrica Duel for surgery on 04/06/2012.  Preop Total Hip orders including Experel Injecion, IV Tylenol, and IV Decadron as long as there are no contraindications to the above medications. Avel Peace, PA-C

## 2012-03-25 ENCOUNTER — Encounter (HOSPITAL_COMMUNITY): Payer: Self-pay | Admitting: Pharmacy Technician

## 2012-03-28 NOTE — Progress Notes (Signed)
EKG 02/11/12 on chart

## 2012-03-29 ENCOUNTER — Ambulatory Visit (HOSPITAL_COMMUNITY)
Admission: RE | Admit: 2012-03-29 | Discharge: 2012-03-29 | Disposition: A | Discharge status: Home / Self Care | Payer: Medicare Other | Source: Ambulatory Visit | Attending: Orthopedic Surgery | Admitting: Orthopedic Surgery

## 2012-03-29 ENCOUNTER — Encounter (HOSPITAL_COMMUNITY): Payer: Self-pay

## 2012-03-29 ENCOUNTER — Inpatient Hospital Stay (HOSPITAL_COMMUNITY): Admission: RE | Admit: 2012-03-29 | Payer: Medicare Other | Source: Ambulatory Visit

## 2012-03-29 ENCOUNTER — Other Ambulatory Visit (HOSPITAL_COMMUNITY): Payer: Self-pay | Admitting: Orthopedic Surgery

## 2012-03-29 ENCOUNTER — Encounter (HOSPITAL_COMMUNITY)
Admission: RE | Admit: 2012-03-29 | Discharge: 2012-03-29 | Disposition: A | Discharge status: Home / Self Care | Payer: Medicare Other | Source: Ambulatory Visit | Attending: Orthopedic Surgery | Admitting: Orthopedic Surgery

## 2012-03-29 DIAGNOSIS — M810 Age-related osteoporosis without current pathological fracture: Secondary | ICD-10-CM | POA: Insufficient documentation

## 2012-03-29 DIAGNOSIS — M87059 Idiopathic aseptic necrosis of unspecified femur: Secondary | ICD-10-CM | POA: Insufficient documentation

## 2012-03-29 DIAGNOSIS — M169 Osteoarthritis of hip, unspecified: Secondary | ICD-10-CM | POA: Insufficient documentation

## 2012-03-29 DIAGNOSIS — Z01812 Encounter for preprocedural laboratory examination: Secondary | ICD-10-CM | POA: Insufficient documentation

## 2012-03-29 DIAGNOSIS — Z01818 Encounter for other preprocedural examination: Secondary | ICD-10-CM | POA: Insufficient documentation

## 2012-03-29 DIAGNOSIS — M1610 Unilateral primary osteoarthritis, unspecified hip: Secondary | ICD-10-CM | POA: Insufficient documentation

## 2012-03-29 HISTORY — DX: Tinnitus, bilateral: H93.13

## 2012-03-29 HISTORY — DX: Cough, unspecified: R05.9

## 2012-03-29 HISTORY — DX: Cough: R05

## 2012-03-29 LAB — URINALYSIS, ROUTINE W REFLEX MICROSCOPIC
Leukocytes, UA: NEGATIVE
Nitrite: NEGATIVE
Specific Gravity, Urine: 1.017 (ref 1.005–1.030)
Urobilinogen, UA: 0.2 mg/dL (ref 0.0–1.0)
pH: 7 (ref 5.0–8.0)

## 2012-03-29 LAB — COMPREHENSIVE METABOLIC PANEL
ALT: 10 U/L (ref 0–35)
Albumin: 3.8 g/dL (ref 3.5–5.2)
Alkaline Phosphatase: 101 U/L (ref 39–117)
Chloride: 97 mEq/L (ref 96–112)
Potassium: 4.2 mEq/L (ref 3.5–5.1)
Sodium: 136 mEq/L (ref 135–145)
Total Bilirubin: 1 mg/dL (ref 0.3–1.2)
Total Protein: 7.5 g/dL (ref 6.0–8.3)

## 2012-03-29 LAB — SURGICAL PCR SCREEN
MRSA, PCR: NEGATIVE
Staphylococcus aureus: NEGATIVE

## 2012-03-29 LAB — CBC
HCT: 39.5 % (ref 36.0–46.0)
Hemoglobin: 13 g/dL (ref 12.0–15.0)
MCHC: 32.9 g/dL (ref 30.0–36.0)
RDW: 13 % (ref 11.5–15.5)
WBC: 6.8 10*3/uL (ref 4.0–10.5)

## 2012-03-29 LAB — ABO/RH: ABO/RH(D): O NEG

## 2012-03-29 LAB — APTT: aPTT: 33 seconds (ref 24–37)

## 2012-03-29 NOTE — Patient Instructions (Addendum)
20 Alyssa [name]  03/29/2012   Your procedure is scheduled on: 04-06-2012  Report to [facility] at 0630 AM.  Call this number if you have problems the morning of surgery [phone]   Remember:   Do not eat food or drink liquids :After Midnight.     Take these medicines the morning of surgery with A SIP OF WATER: natural tears                                SEE [facility] PREPARING FOR SURGERY SHEET   Do not wear jewelry, make-up or nail polish.  Do not wear lotions, powders, or perfumes. You may wear deodorant.   Men may shave face and neck.  Do not bring valuables to the hospital.  Contacts, dentures or bridgework may not be worn into surgery.  Leave suitcase in the car. After surgery it may be brought to your room.  For patients admitted to the hospital, checkout time is 11:00 AM the day of discharge.   Patients discharged the day of surgery will not be allowed to drive home.  Name and phone number of your driver:  Special Instructions: N/A   Please read over the following fact sheets that you were given: MRSA Information, blood fact sheet, incentive spirometer fact sheet Call Cain Sieve RN pre op nurse if needed [phone]    FAILURE TO FOLLOW THESE INSTRUCTIONS MAY RESULT IN THE CANCELLATION OF YOUR SURGERY. PATIENT SIGNATURE___________________________________________

## 2012-03-31 ENCOUNTER — Other Ambulatory Visit (HOSPITAL_COMMUNITY): Payer: Self-pay | Admitting: Cardiovascular Disease

## 2012-03-31 DIAGNOSIS — Z01818 Encounter for other preprocedural examination: Secondary | ICD-10-CM

## 2012-04-01 ENCOUNTER — Ambulatory Visit (HOSPITAL_COMMUNITY)
Admission: RE | Admit: 2012-04-01 | Discharge: 2012-04-01 | Disposition: A | Discharge status: Home / Self Care | Payer: Medicare Other | Source: Ambulatory Visit | Attending: Cardiovascular Disease | Admitting: Cardiovascular Disease

## 2012-04-01 DIAGNOSIS — I10 Essential (primary) hypertension: Secondary | ICD-10-CM | POA: Insufficient documentation

## 2012-04-01 DIAGNOSIS — R079 Chest pain, unspecified: Secondary | ICD-10-CM | POA: Insufficient documentation

## 2012-04-01 DIAGNOSIS — Z01818 Encounter for other preprocedural examination: Secondary | ICD-10-CM

## 2012-04-01 DIAGNOSIS — R42 Dizziness and giddiness: Secondary | ICD-10-CM | POA: Insufficient documentation

## 2012-04-01 MED ORDER — TECHNETIUM TC 99M SESTAMIBI GENERIC - CARDIOLITE
10.0000 | Freq: Once | INTRAVENOUS | Status: AC | PRN
  Administered 2012-04-01: 10 via INTRAVENOUS

## 2012-04-01 MED ORDER — TECHNETIUM TC 99M SESTAMIBI GENERIC - CARDIOLITE
30.0000 | Freq: Once | INTRAVENOUS | Status: AC | PRN
  Administered 2012-04-01: 30 via INTRAVENOUS

## 2012-04-01 MED ORDER — REGADENOSON 0.4 MG/5ML IV SOLN
0.4000 mg | Freq: Once | INTRAVENOUS | Status: AC
  Administered 2012-04-01: 0.4 mg via INTRAVENOUS

## 2012-04-01 NOTE — Procedures (Addendum)
[facility] [address] [place] [address] [phone]  Cardiology Nuclear Med Study  Alyssa [name] is a 79 y.o. female     MRN : [id number]     DOB: [birth year]  Procedure Date: 04/01/2012  Nuclear Med Background Indication for Stress Test:  Surgical Clearance History:  NO PRIOR CARDIAC HISTORY.  Cardiac Risk Factors: CVA, History of Smoking, Hypertension, LBBB and Lipids  Symptoms:  Chest Pain and Dizziness   Nuclear Pre-Procedure Caffeine/Decaff Intake:  10:00pm NPO After: 8:00am   IV Site: R Antecubital  IV 0.9% NS with Angio Cath:  22g  Chest Size (in):  N/A IV Started by: Emmit Pomfret, RN  Height: 5\' 2"  (1.575 m)  Cup Size: B  BMI:  Body mass index is 24.87 kg/(m^2). Weight:  136 lb (61.689 kg)   Tech Comments:  N/A    Nuclear Med Study 1 or 2 day study: 1 day  Stress Test Type:  Lexiscan  Order Authorizing Provider:  Nanetta Batty, MD   Resting Radionuclide: Technetium 8m Sestamibi  Resting Radionuclide Dose: 10.7 mCi   Stress Radionuclide:  Technetium 78m Sestamibi  Stress Radionuclide Dose: 30.5 mCi           Stress Protocol Rest HR: 87 Stress HR: 107  Rest BP: 160/92 Stress BP: 176/78  Exercise Time (min): n/a METS: n/a   Predicted Max HR: 137 bpm % Max HR: 78.1 bpm Rate Pressure Product: 64403  Dose of Adenosine (mg):  n/a Dose of Lexiscan: 0.4 mg  Dose of Atropine (mg): n/a Dose of Dobutamine: n/a mcg/kg/min (at max HR)  Stress Test Technologist: Esperanza Sheets, CCT Nuclear Technologist: Koren Shiver, CNMT   Rest Procedure:  Myocardial perfusion imaging was performed at rest 45 minutes following the intravenous administration of Technetium 44m Sestamibi. Stress Procedure:  The patient received IV Lexiscan 0.4 mg over 15-seconds.  Technetium 31m Sestamibi injected at 30-seconds.  There were no significant changes with Lexiscan.  Quantitative spect images were obtained after a 45 minute  delay.  Transient Ischemic Dilatation (Normal <1.22):  0.82 Lung/Heart Ratio (Normal <0.45):  0.32 QGS EDV:  46 ml QGS ESV:  11 ml LV Ejection Fraction: 77%  Signed by     Rest ECG: NSR-LBBB  Stress ECG: No significant change from baseline ECG  QPS Raw Data Images:  Normal; no motion artifact; normal heart/lung ratio. Stress Images:  There is mild apical thinning with normal uptake in other regions. Rest Images:  There is moderate apical thinning with normal uptake in other regions. Subtraction (SDS):  No evidence of ischemia.  Impression Exercise Capacity:  Lexiscan with no exercise. BP Response:  Normal blood pressure response. Clinical Symptoms:  No significant symptoms noted. ECG Impression:  Baseline:  LBBB.  EKG uninterpretable due to LBBB at rest and stress. Comparison with Prior Nuclear Study: No significant change from previous study  Overall Impression:  Normal stress nuclear study. Low risk stress nuclear study.  LV Wall Motion:  NL LV Function, EF 77%; NL Wall Motion   Lennette Bihari, MD  04/01/2012 6:41 PM

## 2012-04-01 NOTE — Progress Notes (Addendum)
Stress test 04-01-2012 epic  03-24-2011 epic echo lov note cardiac clearance note [phone] dr berry on chart ekg 02-11-2012 on chart

## 2012-04-03 NOTE — H&P (Signed)
TOTAL HIP ADMISSION H&P  Patient is admitted for left total hip arthroplasty.  Subjective:  Chief Complaint: left hip pain  HPI: Alyssa [name], 79 y.o. female, has a history of pain and functional disability in the left hip(s) due to arthritis and patient has failed non-surgical conservative treatments for greater than 12 weeks to include NSAID's and/or analgesics, use of assistive devices and activity modification.  Onset of symptoms was gradual starting 3 years ago with gradually worsening course since that time.The patient noted no past surgery on the left hip(s).  Patient currently rates pain in the left hip at 6 out of 10 with activity. Patient has night pain, worsening of pain with activity and weight bearing, pain that interfers with activities of daily living and pain with passive range of motion. Patient has evidence of subchondral cysts, periarticular osteophytes and joint space narrowing by imaging studies. This condition presents safety issues increasing the risk of falls. There is no current active infection.  Patient Active Problem List   Diagnosis Date Noted  . Left shoulder pain 03/23/2011  . Chest pain at rest 03/23/2011  . Hypertension 03/23/2011  . ARTHRITIS, LEFT HIP 06/06/2009  . SPONDYLOSIS 06/06/2009  . SPONDYLOLYSIS 06/06/2009  . SPONDYLOLITHESIS 06/06/2009   Past Medical History  Diagnosis Date  . Thyroid disease     S/P thyroidectomy  . Hypertension   . High cholesterol     03/23/11 'don't take anything for it"  . Glaucoma, right eye     lens implant done for 4 to  yrs ago  . Arthritis   . Left bundle branch block   . Stroke 03/23/11    "mini strokes; years ago; didn't go to dr;  Peggye Form had babies since then"  . Cancer     "female; not sure if uterus or endometrosis"  . Cough     occasional  . Tinnitus of both ears     Past Surgical History  Procedure Laterality Date  . Eye surgery    . Stab phlebectomies   06/03/10    R leg; "for varicose veins"   . Tonsillectomy  1943  . Appendectomy  ~ 1947  . Cataract extraction w/ intraocular lens implant  ~ 2010    right  . Endovenous ablation saphenous vein w/ laser  04/14/2010    left; GSV  . Intracapsular cataract extraction  ~ 2010    left  . Abdominal hysterectomy  1970  . Breast enhancement surgery  ~ 1980  . Cadaver bone  2011    right small finger; "took more than cyst out too"  . Thyroidectomy  ~ 1970's  . Breast surgery    . Tubal ligation  yrs ago     Current outpatient prescriptions: fish oil-omega-3 fatty acids 1000 MG capsule, Take 1 g by mouth daily., Disp: , Rfl: ;   Hypromellose (NATURAL BALANCE TEARS) 0.4 % SOLN, Apply 1 drop to eye daily as needed (for dry eyes)., Disp: , Rfl: ;   lisinopril (PRINIVIL,ZESTRIL) 10 MG tablet, Take 5 mg by mouth daily at 12 noon., Disp: , Rfl:   Allergies  Allergen Reactions  . Penicillins Swelling    "probably 1970; lips and tongue felt but didn't look swollen; dr told me not to take it again"    History  Substance Use Topics  . Smoking status: Former Smoker    Types: Cigarettes    Quit date: 01/05/1970  . Smokeless tobacco: Never Used     Comment: 03/23/11 'never  really had habit of smoking; I have smoked a few cigarettes"  . Alcohol Use: No   Family History Father deceased age 56 Mother deceased age 58 Family history of HTN, Hodgkin's lymphoma, CVA, and esophageal cancer  Review of Systems  Constitutional: Negative.   HENT: Positive for tinnitus. Negative for hearing loss, ear pain, nosebleeds, congestion, sore throat, neck pain and ear discharge.   Eyes: Negative.   Respiratory: Positive for cough and wheezing. Negative for hemoptysis, sputum production, shortness of breath and stridor.   Cardiovascular: Negative.   Gastrointestinal: Positive for constipation. Negative for heartburn, nausea, vomiting, abdominal pain, diarrhea, blood in stool and melena.  Genitourinary: Negative.   Musculoskeletal: Negative for myalgias,  back pain and falls.       Left hip pain  Skin: Negative.   Neurological: Negative.  Negative for headaches.  Endo/Heme/Allergies: Negative.   Psychiatric/Behavioral: Negative.     Objective:  Physical Exam  Constitutional: She is oriented to person, place, and time. She appears well-developed and well-nourished. No distress.  HENT:  Head: Normocephalic and atraumatic.  Right Ear: External ear normal.  Nose: Nose normal.  Mouth/Throat: Oropharynx is clear and moist.  Eyes: Conjunctivae and EOM are normal.  Neck: Normal range of motion. Neck supple. No tracheal deviation present. No thyromegaly present.  Cardiovascular: Normal rate, regular rhythm, normal heart sounds and intact distal pulses.   No murmur heard. Respiratory: Effort normal and breath sounds normal. No respiratory distress. She has no wheezes. She exhibits no tenderness.  GI: Soft. Bowel sounds are normal. She exhibits no distension and no mass. There is no tenderness.  Musculoskeletal:       Right hip: Normal.       Left hip: She exhibits decreased range of motion and decreased strength.       Right knee: Normal.       Left knee: Normal.       Right lower leg: She exhibits no tenderness and no swelling.       Left lower leg: She exhibits no tenderness and no swelling.  Evaluation of her left hip, flexion to 90, no internal or external rotation. No abduction. Right hip with a normal range of motion. Gait pattern is significantly antalgic on the left side.  Neurological: She is alert and oriented to person, place, and time. She has normal strength and normal reflexes. No sensory deficit.  Skin: No rash noted. She is not diaphoretic. No erythema.  Psychiatric: She has a normal mood and affect. Her behavior is normal.   Vitals Pulse: 72 (Regular) Resp.: 16 (Unlabored) BP: 138/72 (Sitting, Left Arm, Standard)  Estimated body mass index is 24.80 kg/(m^2) as calculated from the following:   Height as of  03/23/11: 5\' 2"  (1.575 m).   Weight as of 03/23/11: 61.508 kg (135 lb 9.6 oz).   Imaging Review Plain radiographs demonstrate severe degenerative joint disease of the left hip(s). The bone quality appears to be good for age and reported activity level.  Assessment/Plan:  End stage arthritis, left hip(s)  The patient history, physical examination, clinical judgement of the provider and imaging studies are consistent with end stage degenerative joint disease of the left hip(s) and total hip arthroplasty is deemed medically necessary. The treatment options including medical management, injection therapy, arthroscopy and arthroplasty were discussed at length. The risks and benefits of total hip arthroplasty were presented and reviewed. The risks due to aseptic loosening, infection, stiffness, dislocation/subluxation,  thromboembolic complications and other imponderables were discussed.  The patient acknowledged the explanation, agreed to proceed with the plan and consent was signed. Patient is being admitted for inpatient treatment for surgery, pain control, PT, OT, prophylactic antibiotics, VTE prophylaxis, progressive ambulation and ADL's and discharge planning.The patient is planning to be discharged home with home health services    [place], [place]

## 2012-04-05 NOTE — Anesthesia Preprocedure Evaluation (Addendum)
Anesthesia Evaluation  Patient identified by MRN, date of birth, ID band Patient awake    Reviewed: Allergy & Precautions, H&P , NPO status , Patient's Chart, lab work & pertinent test results  Airway Mallampati: II TM Distance: >3 FB Neck ROM: full    Dental  (+) Caps and Dental Advisory Given All upper front are capped:   Pulmonary neg pulmonary ROS,  breath sounds clear to auscultation  Pulmonary exam normal       Cardiovascular Exercise Tolerance: Good hypertension, On Medications Rhythm:regular Rate:Normal  LBBB   Neuro/Psych Very remote mini strokes with no residual CVA, No Residual Symptoms negative neurological ROS  negative psych ROS   GI/Hepatic negative GI ROS, Neg liver ROS,   Endo/Other  negative endocrine ROS  Renal/GU negative Renal ROS  negative genitourinary   Musculoskeletal   Abdominal   Peds  Hematology negative hematology ROS (+)   Anesthesia Other Findings   Reproductive/Obstetrics negative OB ROS                          Anesthesia Physical Anesthesia Plan  ASA: III  Anesthesia Plan: General   Post-op Pain Management:    Induction: Intravenous  Airway Management Planned: Oral ETT  Additional Equipment:   Intra-op Plan:   Post-operative Plan: Extubation in OR  Informed Consent: I have reviewed the patients History and Physical, chart, labs and discussed the procedure including the risks, benefits and alternatives for the proposed anesthesia with the patient or authorized representative who has indicated his/her understanding and acceptance.   Dental Advisory Given  Plan Discussed with: CRNA and Surgeon  Anesthesia Plan Comments:         Anesthesia Quick Evaluation

## 2012-04-06 ENCOUNTER — Encounter (HOSPITAL_COMMUNITY): Payer: Self-pay | Admitting: *Deleted

## 2012-04-06 ENCOUNTER — Inpatient Hospital Stay (HOSPITAL_COMMUNITY): Payer: Medicare Other

## 2012-04-06 ENCOUNTER — Inpatient Hospital Stay (HOSPITAL_COMMUNITY)
Admission: RE | Admit: 2012-04-06 | Discharge: 2012-04-08 | DRG: 470 | Disposition: A | Discharge status: Home Health | Payer: Medicare Other | Source: Ambulatory Visit | Attending: Orthopedic Surgery | Admitting: Orthopedic Surgery

## 2012-04-06 ENCOUNTER — Encounter (HOSPITAL_COMMUNITY): Payer: Self-pay | Admitting: Anesthesiology

## 2012-04-06 ENCOUNTER — Encounter (HOSPITAL_COMMUNITY)
Admission: RE | Disposition: A | Discharge status: Home Health | Payer: Self-pay | Source: Ambulatory Visit | Attending: Orthopedic Surgery

## 2012-04-06 ENCOUNTER — Inpatient Hospital Stay (HOSPITAL_COMMUNITY): Payer: Medicare Other | Admitting: Anesthesiology

## 2012-04-06 DIAGNOSIS — D62 Acute posthemorrhagic anemia: Secondary | ICD-10-CM

## 2012-04-06 DIAGNOSIS — Z96649 Presence of unspecified artificial hip joint: Secondary | ICD-10-CM

## 2012-04-06 DIAGNOSIS — M1610 Unilateral primary osteoarthritis, unspecified hip: Principal | ICD-10-CM | POA: Diagnosis present

## 2012-04-06 DIAGNOSIS — M169 Osteoarthritis of hip, unspecified: Secondary | ICD-10-CM

## 2012-04-06 DIAGNOSIS — I10 Essential (primary) hypertension: Secondary | ICD-10-CM | POA: Diagnosis present

## 2012-04-06 DIAGNOSIS — Z8673 Personal history of transient ischemic attack (TIA), and cerebral infarction without residual deficits: Secondary | ICD-10-CM

## 2012-04-06 HISTORY — PX: TOTAL HIP ARTHROPLASTY: SHX124

## 2012-04-06 LAB — TYPE AND SCREEN: Antibody Screen: NEGATIVE

## 2012-04-06 SURGERY — ARTHROPLASTY, HIP, TOTAL,POSTERIOR APPROACH
Anesthesia: General | Site: Hip | Laterality: Left | Wound class: Clean

## 2012-04-06 MED ORDER — ONDANSETRON HCL 4 MG/2ML IJ SOLN
INTRAMUSCULAR | Status: DC | PRN
  Administered 2012-04-06: 4 mg via INTRAVENOUS

## 2012-04-06 MED ORDER — METHOCARBAMOL 500 MG PO TABS: 500.0000 mg | ORAL_TABLET | Freq: Four times a day (QID) | ORAL | Status: DC | PRN

## 2012-04-06 MED ORDER — STERILE WATER FOR IRRIGATION IR SOLN
Status: DC | PRN
  Administered 2012-04-06: 3000 mL

## 2012-04-06 MED ORDER — EPHEDRINE SULFATE 50 MG/ML IJ SOLN
INTRAMUSCULAR | Status: DC | PRN
  Administered 2012-04-06: 5 mg via INTRAVENOUS

## 2012-04-06 MED ORDER — VANCOMYCIN HCL IN DEXTROSE 1-5 GM/200ML-% IV SOLN
1000.0000 mg | INTRAVENOUS | Status: AC
  Administered 2012-04-06: 1000 mg via INTRAVENOUS

## 2012-04-06 MED ORDER — DOCUSATE SODIUM 100 MG PO CAPS
100.0000 mg | ORAL_CAPSULE | Freq: Two times a day (BID) | ORAL | Status: DC
  Administered 2012-04-06 – 2012-04-08 (×4): 100 mg via ORAL

## 2012-04-06 MED ORDER — SUCCINYLCHOLINE CHLORIDE 20 MG/ML IJ SOLN
INTRAMUSCULAR | Status: DC | PRN
  Administered 2012-04-06: 100 mg via INTRAVENOUS
  Administered 2012-04-06: 60 mg via INTRAVENOUS

## 2012-04-06 MED ORDER — ACETAMINOPHEN 10 MG/ML IV SOLN
INTRAVENOUS | Status: DC | PRN
  Administered 2012-04-06: 1000 mg via INTRAVENOUS

## 2012-04-06 MED ORDER — NEOSTIGMINE METHYLSULFATE 1 MG/ML IJ SOLN
INTRAMUSCULAR | Status: DC | PRN
  Administered 2012-04-06: 2 mg via INTRAVENOUS

## 2012-04-06 MED ORDER — LACTATED RINGERS IV SOLN: INTRAVENOUS | Status: DC

## 2012-04-06 MED ORDER — PHENOL 1.4 % MT LIQD: 1.0000 | OROMUCOSAL | Status: DC | PRN

## 2012-04-06 MED ORDER — VANCOMYCIN HCL IN DEXTROSE 1-5 GM/200ML-% IV SOLN
1000.0000 mg | Freq: Two times a day (BID) | INTRAVENOUS | Status: AC
  Administered 2012-04-06: 1000 mg via INTRAVENOUS
  Filled 2012-04-06: qty 200

## 2012-04-06 MED ORDER — FENTANYL CITRATE 0.05 MG/ML IJ SOLN
INTRAMUSCULAR | Status: DC | PRN
  Administered 2012-04-06: 100 ug via INTRAVENOUS
  Administered 2012-04-06: 50 ug via INTRAVENOUS
  Administered 2012-04-06 (×2): 100 ug via INTRAVENOUS

## 2012-04-06 MED ORDER — MENTHOL 3 MG MT LOZG: 1.0000 | LOZENGE | OROMUCOSAL | Status: DC | PRN

## 2012-04-06 MED ORDER — ACETAMINOPHEN 325 MG PO TABS: 650.0000 mg | ORAL_TABLET | Freq: Four times a day (QID) | ORAL | Status: DC | PRN

## 2012-04-06 MED ORDER — DEXTROSE-NACL 5-0.9 % IV SOLN: INTRAVENOUS | Status: DC

## 2012-04-06 MED ORDER — DIPHENHYDRAMINE HCL 50 MG/ML IJ SOLN
INTRAMUSCULAR | Status: DC | PRN
  Administered 2012-04-06: 12.5 mg via INTRAVENOUS

## 2012-04-06 MED ORDER — DEXAMETHASONE 6 MG PO TABS
10.0000 mg | ORAL_TABLET | Freq: Once | ORAL | Status: AC
  Administered 2012-04-07: 10 mg via ORAL
  Filled 2012-04-06: qty 1

## 2012-04-06 MED ORDER — DEXAMETHASONE SODIUM PHOSPHATE 10 MG/ML IJ SOLN: 10.0000 mg | Freq: Once | INTRAMUSCULAR | Status: AC

## 2012-04-06 MED ORDER — METOCLOPRAMIDE HCL 10 MG PO TABS: 5.0000 mg | ORAL_TABLET | Freq: Three times a day (TID) | ORAL | Status: DC | PRN

## 2012-04-06 MED ORDER — PROPOFOL 10 MG/ML IV BOLUS
INTRAVENOUS | Status: DC | PRN
  Administered 2012-04-06: 60 mg via INTRAVENOUS
  Administered 2012-04-06: 50 mg via INTRAVENOUS
  Administered 2012-04-06: 150 mg via INTRAVENOUS

## 2012-04-06 MED ORDER — RIVAROXABAN 10 MG PO TABS
10.0000 mg | ORAL_TABLET | Freq: Every day | ORAL | Status: DC
  Administered 2012-04-07 – 2012-04-08 (×2): 10 mg via ORAL
  Filled 2012-04-06 (×3): qty 1

## 2012-04-06 MED ORDER — LACTATED RINGERS IV SOLN
INTRAVENOUS | Status: DC | PRN
  Administered 2012-04-06 (×2): via INTRAVENOUS

## 2012-04-06 MED ORDER — ONDANSETRON HCL 4 MG PO TABS: 4.0000 mg | ORAL_TABLET | Freq: Four times a day (QID) | ORAL | Status: DC | PRN

## 2012-04-06 MED ORDER — METHOCARBAMOL 100 MG/ML IJ SOLN
500.0000 mg | Freq: Four times a day (QID) | INTRAVENOUS | Status: DC | PRN
  Filled 2012-04-06: qty 5

## 2012-04-06 MED ORDER — DIPHENHYDRAMINE HCL 12.5 MG/5ML PO ELIX: 12.5000 mg | ORAL_SOLUTION | ORAL | Status: DC | PRN

## 2012-04-06 MED ORDER — ACETAMINOPHEN 10 MG/ML IV SOLN: 1000.0000 mg | Freq: Once | INTRAVENOUS | Status: DC

## 2012-04-06 MED ORDER — POLYETHYLENE GLYCOL 3350 17 G PO PACK: 17.0000 g | PACK | Freq: Every day | ORAL | Status: DC | PRN

## 2012-04-06 MED ORDER — ACETAMINOPHEN 650 MG RE SUPP: 650.0000 mg | Freq: Four times a day (QID) | RECTAL | Status: DC | PRN

## 2012-04-06 MED ORDER — BISACODYL 10 MG RE SUPP: 10.0000 mg | Freq: Every day | RECTAL | Status: DC | PRN

## 2012-04-06 MED ORDER — MORPHINE SULFATE 2 MG/ML IJ SOLN: 1.0000 mg | INTRAMUSCULAR | Status: DC | PRN

## 2012-04-06 MED ORDER — PHENYLEPHRINE HCL 10 MG/ML IJ SOLN
INTRAMUSCULAR | Status: DC | PRN
  Administered 2012-04-06 (×2): 80 ug via INTRAVENOUS
  Administered 2012-04-06: 120 ug via INTRAVENOUS

## 2012-04-06 MED ORDER — FENTANYL CITRATE 0.05 MG/ML IJ SOLN: 25.0000 ug | INTRAMUSCULAR | Status: DC | PRN

## 2012-04-06 MED ORDER — SODIUM CHLORIDE 0.9 % IV SOLN: INTRAVENOUS | Status: DC

## 2012-04-06 MED ORDER — GLYCOPYRROLATE 0.2 MG/ML IJ SOLN
INTRAMUSCULAR | Status: DC | PRN
  Administered 2012-04-06: .2 mg via INTRAVENOUS

## 2012-04-06 MED ORDER — LIDOCAINE HCL (CARDIAC) 20 MG/ML IV SOLN
INTRAVENOUS | Status: DC | PRN
  Administered 2012-04-06: 70 mg via INTRAVENOUS

## 2012-04-06 MED ORDER — ACETAMINOPHEN 10 MG/ML IV SOLN
1000.0000 mg | Freq: Four times a day (QID) | INTRAVENOUS | Status: AC
  Administered 2012-04-06 – 2012-04-07 (×4): 1000 mg via INTRAVENOUS
  Filled 2012-04-06 (×5): qty 100

## 2012-04-06 MED ORDER — ONDANSETRON HCL 4 MG/2ML IJ SOLN: 4.0000 mg | Freq: Four times a day (QID) | INTRAMUSCULAR | Status: DC | PRN

## 2012-04-06 MED ORDER — MIDAZOLAM HCL 5 MG/5ML IJ SOLN
INTRAMUSCULAR | Status: DC | PRN
  Administered 2012-04-06: 1 mg via INTRAVENOUS

## 2012-04-06 MED ORDER — TRAMADOL HCL 50 MG PO TABS: 50.0000 mg | ORAL_TABLET | Freq: Four times a day (QID) | ORAL | Status: DC | PRN

## 2012-04-06 MED ORDER — OXYCODONE HCL 5 MG PO TABS: 5.0000 mg | ORAL_TABLET | ORAL | Status: DC | PRN

## 2012-04-06 MED ORDER — ROCURONIUM BROMIDE 100 MG/10ML IV SOLN
INTRAVENOUS | Status: DC | PRN
  Administered 2012-04-06: 25 mg via INTRAVENOUS

## 2012-04-06 MED ORDER — FLEET ENEMA 7-19 GM/118ML RE ENEM: 1.0000 | ENEMA | Freq: Once | RECTAL | Status: AC | PRN

## 2012-04-06 MED ORDER — METOCLOPRAMIDE HCL 5 MG/ML IJ SOLN: 5.0000 mg | Freq: Three times a day (TID) | INTRAMUSCULAR | Status: DC | PRN

## 2012-04-06 MED ORDER — BUPIVACAINE LIPOSOME 1.3 % IJ SUSP
20.0000 mL | Freq: Once | INTRAMUSCULAR | Status: DC
  Filled 2012-04-06: qty 20

## 2012-04-06 MED ORDER — DEXAMETHASONE SODIUM PHOSPHATE 10 MG/ML IJ SOLN
INTRAMUSCULAR | Status: DC | PRN
  Administered 2012-04-06: 10 mg via INTRAVENOUS

## 2012-04-06 MED ORDER — 0.9 % SODIUM CHLORIDE (POUR BTL) OPTIME
TOPICAL | Status: DC | PRN
  Administered 2012-04-06: 1000 mL

## 2012-04-06 MED ORDER — SODIUM CHLORIDE 0.9 % IJ SOLN
INTRAMUSCULAR | Status: DC | PRN
  Administered 2012-04-06: 09:00:00

## 2012-04-06 SURGICAL SUPPLY — 50 items
BAG SPEC THK2 15X12 ZIP CLS (MISCELLANEOUS) ×1
BAG ZIPLOCK 12X15 (MISCELLANEOUS) ×2 IMPLANT
BIT DRILL 2.8X128 (BIT) ×2 IMPLANT
BLADE EXTENDED COATED 6.5IN (ELECTRODE) ×2 IMPLANT
BLADE SAW SAG 73X25 THK (BLADE) ×1
BLADE SAW SGTL 73X25 THK (BLADE) ×1 IMPLANT
CLOTH BEACON ORANGE TIMEOUT ST (SAFETY) ×2 IMPLANT
DECANTER SPIKE VIAL GLASS SM (MISCELLANEOUS) ×2 IMPLANT
DRAPE INCISE IOBAN 66X45 STRL (DRAPES) ×2 IMPLANT
DRAPE ORTHO SPLIT 77X108 STRL (DRAPES) ×4
DRAPE POUCH INSTRU U-SHP 10X18 (DRAPES) ×2 IMPLANT
DRAPE SURG ORHT 6 SPLT 77X108 (DRAPES) ×2 IMPLANT
DRAPE U-SHAPE 47X51 STRL (DRAPES) ×2 IMPLANT
DRSG ADAPTIC 3X8 NADH LF (GAUZE/BANDAGES/DRESSINGS) ×1 IMPLANT
DRSG MEPILEX BORDER 4X4 (GAUZE/BANDAGES/DRESSINGS) ×2 IMPLANT
DRSG MEPILEX BORDER 4X8 (GAUZE/BANDAGES/DRESSINGS) ×2 IMPLANT
DURAPREP 26ML APPLICATOR (WOUND CARE) ×2 IMPLANT
ELECT REM PT RETURN 9FT ADLT (ELECTROSURGICAL) ×2
ELECTRODE REM PT RTRN 9FT ADLT (ELECTROSURGICAL) ×1 IMPLANT
EVACUATOR 1/8 PVC DRAIN (DRAIN) ×2 IMPLANT
FACESHIELD LNG OPTICON STERILE (SAFETY) ×8 IMPLANT
GLOVE BIO SURGEON STRL SZ7.5 (GLOVE) ×2 IMPLANT
GLOVE BIO SURGEON STRL SZ8 (GLOVE) ×2 IMPLANT
GLOVE BIOGEL PI IND STRL 8 (GLOVE) ×2 IMPLANT
GLOVE BIOGEL PI INDICATOR 8 (GLOVE) ×2
GLOVE SURG SS PI 6.5 STRL IVOR (GLOVE) ×4 IMPLANT
GOWN STRL NON-REIN LRG LVL3 (GOWN DISPOSABLE) ×4 IMPLANT
GOWN STRL REIN XL XLG (GOWN DISPOSABLE) ×2 IMPLANT
IMMOBILIZER KNEE 20 (SOFTGOODS) ×2
IMMOBILIZER KNEE 20 THIGH 36 (SOFTGOODS) IMPLANT
KIT BASIN OR (CUSTOM PROCEDURE TRAY) ×2 IMPLANT
MANIFOLD NEPTUNE II (INSTRUMENTS) ×2 IMPLANT
NDL SAFETY ECLIPSE 18X1.5 (NEEDLE) ×1 IMPLANT
NEEDLE HYPO 18GX1.5 SHARP (NEEDLE) ×2
NS IRRIG 1000ML POUR BTL (IV SOLUTION) ×2 IMPLANT
PACK TOTAL JOINT (CUSTOM PROCEDURE TRAY) ×2 IMPLANT
PASSER SUT SWANSON 36MM LOOP (INSTRUMENTS) ×2 IMPLANT
POSITIONER SURGICAL ARM (MISCELLANEOUS) ×2 IMPLANT
SPONGE GAUZE 4X4 12PLY (GAUZE/BANDAGES/DRESSINGS) ×2 IMPLANT
STRIP CLOSURE SKIN 1/2X4 (GAUZE/BANDAGES/DRESSINGS) ×4 IMPLANT
SUT ETHIBOND NAB CT1 #1 30IN (SUTURE) ×4 IMPLANT
SUT MNCRL AB 4-0 PS2 18 (SUTURE) ×2 IMPLANT
SUT VIC AB 2-0 CT1 27 (SUTURE) ×4
SUT VIC AB 2-0 CT1 TAPERPNT 27 (SUTURE) ×3 IMPLANT
SUT VLOC 180 0 24IN GS25 (SUTURE) ×3 IMPLANT
SYR 50ML LL SCALE MARK (SYRINGE) ×2 IMPLANT
TOWEL OR 17X26 10 PK STRL BLUE (TOWEL DISPOSABLE) ×4 IMPLANT
TOWEL OR NON WOVEN STRL DISP B (DISPOSABLE) ×2 IMPLANT
TRAY FOLEY CATH 14FRSI W/METER (CATHETERS) ×2 IMPLANT
WATER STERILE IRR 1500ML POUR (IV SOLUTION) ×3 IMPLANT

## 2012-04-06 NOTE — Anesthesia Postprocedure Evaluation (Signed)
  Anesthesia Post-op Note  Patient: Alyssa [name]  Procedure(s) Performed: Procedure(s) (LRB): TOTAL HIP ARTHROPLASTY (Left)  Patient Location: PACU  Anesthesia Type: General  Level of Consciousness: awake and alert   Airway and Oxygen Therapy: Patient Spontanous Breathing  Post-op Pain: mild  Post-op Assessment: Post-op Vital signs reviewed, Patient's Cardiovascular Status Stable, Respiratory Function Stable, Patent Airway and No signs of Nausea or vomiting  Last Vitals:  Filed Vitals:   04/06/12 1555  BP:   Pulse:   Temp:   Resp: 16    Post-op Vital Signs: stable   Complications: No apparent anesthesia complications

## 2012-04-06 NOTE — Evaluation (Signed)
Physical Therapy Evaluation Patient Details Name: Alyssa [name] MRN: [id number] DOB: 01-11-1927 Today's Date: 04/06/2012 Time: 4098-1191 PT Time Calculation (min): 33 min  PT Assessment / Plan / Recommendation Clinical Impression  s/p posterior THA  and will benefit from PT to maximize independence for D/C to daughter's home    PT Assessment  Patient needs continued PT services    Follow Up Recommendations  Home health PT    Does the patient have the potential to tolerate intense rehabilitation      Barriers to Discharge        Equipment Recommendations  Rolling walker with 5" wheels    Recommendations for Other Services     Frequency 7X/week    Precautions / Restrictions Precautions Precautions: Knee   Pertinent Vitals/Pain  No c/o pain     Mobility  Bed Mobility Bed Mobility: Supine to Sit Supine to Sit: 4: Min assist Details for Bed Mobility Assistance: assist with UB Transfers Transfers: Sit to Stand;Stand to Sit Sit to Stand: 4: Min guard Stand to Sit: 4: Min guard Details for Transfer Assistance: cues for THP and hand placement Ambulation/Gait Ambulation/Gait Assistance: 4: Min Environmental consultant (Feet): 80 Feet Assistive device: Rolling walker Ambulation/Gait Assistance Details: cues RW safety Gait Pattern: Step-to pattern;Step-through pattern    Exercises Total Joint Exercises Ankle Circles/Pumps: AROM;Both;10 reps Heel Slides: AROM;AAROM;10 reps;Left   PT Diagnosis: Difficulty walking  PT Problem List: Decreased strength;Decreased range of motion;Decreased activity tolerance;Decreased balance;Decreased knowledge of use of DME PT Treatment Interventions: DME instruction;Gait training;Functional mobility training;Therapeutic activities;Therapeutic exercise;Stair training;Patient/family education   PT Goals Acute Rehab PT Goals PT Goal Formulation: With patient Time For Goal Achievement: 04/09/12 Pt will go Supine/Side to Sit: with  supervision PT Goal: Supine/Side to Sit - Progress: Goal set today Pt will go Sit to Supine/Side: with supervision PT Goal: Sit to Supine/Side - Progress: Goal set today Pt will go Sit to Stand: with supervision PT Goal: Sit to Stand - Progress: Goal set today Pt will Ambulate: 51 - 150 feet;with rolling walker;with supervision PT Goal: Ambulate - Progress: Goal set today Pt will Go Up / Down Stairs: 3-5 stairs;with min assist;with rail(s);with least restrictive assistive device PT Goal: Up/Down Stairs - Progress: Goal set today Pt will Perform Home Exercise Program: with supervision, verbal cues required/provided PT Goal: Perform Home Exercise Program - Progress: Goal set today  Visit Information  Last PT Received On: 04/06/12 Assistance Needed: +1    Subjective Data  Subjective: no pain Patient Stated Goal: home with dtr   Prior Functioning  Home Living Lives With: Family Available Help at Discharge: Family;Available 24 hours/day Type of Home: House Home Access: Stairs to enter;Ramped entrance Entrance Stairs-Number of Steps: 4; ramp to MIL quarters Entrance Stairs-Rails: Right Home Layout: One level Home Adaptive Equipment: Walker - four wheeled;Straight cane Prior Function Level of Independence: Independent Able to Take Stairs?: Yes Comments: difficulty puttinmg on L sock Communication Communication: No difficulties    Cognition  Cognition Overall Cognitive Status: Appears within functional limits for tasks assessed/performed Arousal/Alertness: Awake/alert Orientation Level: Appears intact for tasks assessed Behavior During Session: [facility] for tasks performed    Extremity/Trunk Assessment Right Upper Extremity Assessment RUE ROM/Strength/Tone: [facility] for tasks assessed Left Upper Extremity Assessment LUE ROM/Strength/Tone: WFL for tasks assessed Right Lower Extremity Assessment RLE ROM/Strength/Tone: [facility] for tasks assessed Left Lower Extremity Assessment LLE  ROM/Strength/Tone: Deficits LLE ROM/Strength/Tone Deficits: AAROM WFL; able to assist with SLR   Balance    End of  Session PT - End of Session Equipment Utilized During Treatment: Gait belt Activity Tolerance: Patient tolerated treatment well Patient left: in chair;with call bell/phone within reach;with family/visitor present  GP     [facility] 04/06/2012, 4:28 PM

## 2012-04-06 NOTE — Interval H&P Note (Signed)
History and Physical Interval Note:  04/06/2012 8:17 AM  Alyssa [name]  has presented today for surgery, with the diagnosis of OSTEOARTHRITIS LEFT HIP  The various methods of treatment have been discussed with the patient and family. After consideration of risks, benefits and other options for treatment, the patient has consented to  Procedure(s): TOTAL HIP ARTHROPLASTY (Left) as a surgical intervention .  The patient's history has been reviewed, patient examined, no change in status, stable for surgery.  I have reviewed the patient's chart and labs.  Questions were answered to the patient's satisfaction.     Loanne Drilling

## 2012-04-06 NOTE — Progress Notes (Signed)
Utilization review completed.  

## 2012-04-06 NOTE — Transfer of Care (Signed)
Immediate Anesthesia Transfer of Care Note  Patient: Alyssa [name]  Procedure(s) Performed: Procedure(s): TOTAL HIP ARTHROPLASTY (Left)  Patient Location: PACU  Anesthesia Type:General  Level of Consciousness: awake, alert , oriented and patient cooperative  Airway & Oxygen Therapy: Patient Spontanous Breathing and Patient connected to face mask oxygen  Post-op Assessment: Report given to PACU RN, Post -op Vital signs reviewed and stable and Patient moving all extremities X 4  Post vital signs: stable  Complications: No apparent anesthesia complications

## 2012-04-06 NOTE — Op Note (Signed)
Pre-operative diagnosis- Osteoarthritis Left hip  Post-operative diagnosis- Osteoarthritis  Left hip  Procedure-  LeftTotal Hip Arthroplasty  Surgeon- Gus Rankin. Ahaan Zobrist, MD  Assistant- Avel Peace, PA-C   Anesthesia  General  EBL- 250   Drain hemovac   Complication- None  Condition-PACU - hemodynamically stable.   Brief Clinical Note-  Alyssa [name] is a 79 y.o. female with end stage arthritis of her left hip with progressively worsening pain and dysfunction. Pain occurs with activity and rest including pain at night. She has tried analgesics, protected weight bearing and rest without benefit. Pain is too severe to attempt physical therapy. Radiographs demonstrate bone on bone arthritis with subchondral cyst formation. She presents now for left THA.  Procedure in detail-   The patient is brought into the operating room and placed on the operating table. After successful administration of General  anesthesia, the patient is placed in the  Right lateral decubitus position with the  Left side up and held in place with the hip positioner. The lower extremity is isolated from the perineum with plastic drapes and time-out is performed by the surgical team. The lower extremity is then prepped and draped in the usual sterile fashion. A short posterolateral incision is made with a ten blade through the subcutaneous tissue to the level of the fascia lata which is incised in line with the skin incision. The sciatic nerve is palpated and protected and the short external rotators and capsule are isolated from the femur. The hip is then dislocated and the center of the femoral head is marked. A trial prosthesis is placed such that the trial head corresponds to the center of the patients' native femoral head. The resection level is marked on the femoral neck and the resection is made with an oscillating saw. The femoral head is removed and femoral retractors placed to gain access to the femoral canal.      The canal finder is passed into the femoral canal and the canal is thoroughly irrigated with sterile saline to remove the fatty contents. Axial reaming is performed to 13.5  mm, proximal reaming to 69F  and the sleeve machined to a large. A 18 F large trial sleeve is placed into the proximal femur.      The femur is then retracted anteriorly to gain acetabular exposure. Acetabular retractors are placed and the labrum and osteophytes are removed, Acetabular reaming is performed to 51  mm and a 52  mm Pinnacle acetabular shell is placed in anatomic position with excellent purchase. Additional dome screws were not needed. The permanent 32 mm neutral + 4 Marathon liner is placed into the acetabular shell.      The trial femur is then placed into the femoral canal. The size is 18 x 13  stem with a 36 + 8  neck and a 32 +3 head with the neck version matching  the patients' native anteversion. The hip is reduced with excellent stability with full extension and full external rotation, 70 degrees flexion with 40 degrees adduction and 90 degrees internal rotation and 90 degrees of flexion with 70 degrees of internal rotation. The operative leg is placed on top of the non-operative leg and the leg lengths are found to be equal. The trials are then removed and the permanent implant of the same size is impacted into the femoral canal. The   metal femoral head of the same size as the trial is placed and the hip is reduced with the same stability  parameters. The operative leg is again placed on top of the non-operative leg and the leg lengths are found to be equal.      The wound is then copiously irrigated with saline solution and the capsule and short external rotators are re-attached to the femur through drill holes with Ethibond suture. The fascia lata is closed over a hemovac drain with #1 vicryl suture and the fascia lata, gluteal muscles and subcutaneous tissues are injected with Exparel 20ml diluted with saline  50ml. The subcutaneous tissues are closed with #1 and2-0 vicryl and the subcuticular layer closed with running 4-0 Monocryl. The drain is hooked to suction, incision cleaned and dried, and steri-srips and a bulky sterile dressing applied. The limb is placed into a knee immobilizer and the patient is awakened and transported to recovery in stable condition.      Please note that a surgical assistant was a medical necessity for this procedure in order to perform it in a safe and expeditious manner. The assistant was necessary to provide retraction to the vital neurovascular structures and to retract and position the limb to allow for anatomic placement of the prosthetic components.  Gus Rankin Simara Rhyner, MD    04/06/2012, 9:35 AM

## 2012-04-07 ENCOUNTER — Encounter (HOSPITAL_COMMUNITY): Payer: Self-pay | Admitting: Orthopedic Surgery

## 2012-04-07 LAB — CBC
MCHC: 34.8 g/dL (ref 30.0–36.0)
Platelets: 189 10*3/uL (ref 150–400)
RDW: 13 % (ref 11.5–15.5)

## 2012-04-07 LAB — BASIC METABOLIC PANEL
GFR calc Af Amer: >90 mL/min (ref >90)
GFR calc non Af Amer: 81 mL/min — ABNORMAL LOW (ref >90)
Potassium: 4.3 mEq/L (ref 3.5–5.1)
Sodium: 135 mEq/L (ref 135–145)

## 2012-04-07 NOTE — Progress Notes (Signed)
04/07/12 1400  PT Visit Information  Last PT Received On 04/07/12  Assistance Needed +1  PT Time Calculation  PT Start Time 1425  PT Stop Time 1449  PT Time Calculation (min) 24 min  Subjective Data  Subjective pt up in bathroom  Patient Stated Goal home with dtr  Precautions  Precautions Posterior Hip  Restrictions  Weight Bearing Restrictions No  Cognition  Overall Cognitive Status Appears within functional limits for tasks assessed/performed  Arousal/Alertness Awake/alert  Orientation Level Appears intact for tasks assessed  Behavior During Session [facility] for tasks performed  Transfers  Transfers Sit to Stand;Stand to Sit  Sit to Stand 4: Min guard;5: Supervision  Stand to Sit 4: Min guard;5: Supervision  Details for Transfer Assistance cues for THP and hand placement  Ambulation/Gait  Ambulation/Gait Assistance 4: Min guard;5: Supervision  Ambulation Distance (Feet) 200 Feet  Assistive device Rolling walker  Ambulation/Gait Assistance Details cues for upward gaze,  posture,  Rw use  Gait Pattern Step-to pattern;Step-through pattern  Total Joint Exercises  Ankle Circles/Pumps AROM;Both;10 reps  Heel Slides AROM;AAROM;10 reps;Left  Quad Sets AROM;Both;10 reps  Short Arc Quad AROM;Left;10 reps  Hip ABduction/ADduction AROM;AAROM;Both;10 reps  PT - End of Session  Activity Tolerance Patient tolerated treatment well  Patient left in chair;with call bell/phone within reach;with family/visitor present  PT - Assessment/Plan  Comments on Treatment Session pt progressing well  PT Plan Discharge plan remains appropriate;Frequency remains appropriate  PT Frequency 7X/week  Follow Up Recommendations Home health PT  PT equipment Rolling walker with 5" wheels  Acute Rehab PT Goals  Time For Goal Achievement 04/09/12  Potential to Achieve Goals Good  Pt will go Sit to Stand with supervision  PT Goal: Sit to Stand - Progress Progressing toward goal  Pt will Ambulate 51 - 150  feet;with rolling walker;with supervision  PT Goal: Ambulate - Progress Progressing toward goal  Pt will Go Up / Down Stairs 3-5 stairs;with min assist;with rail(s);with least restrictive assistive device  PT General Charges  $$ ACUTE PT VISIT 1 Procedure  PT Treatments  $Gait Training 8-22 mins  $Therapeutic Exercise 8-22 mins

## 2012-04-07 NOTE — Care Management Note (Addendum)
    Page 1 of 2   04/08/2012     11:41:49 AM   CARE MANAGEMENT NOTE 04/08/2012  Patient:  Alyssa [name], Alyssa [name]   Account Number:  [id number]  Date Initiated:  04/07/2012  Documentation initiated by:  Colleen Can  Subjective/Objective Assessment:   DX LEFT HIP OSTEOARTHRITIS; TOTAL HIP REPLACEMNT     Action/Plan:   CM spoke with patient and daughter. Plans are for patient to go to daughter's home in [facility] where she will recuperate. Daughter will be caregiver. Pt already has cane, w/c, 4 wheeled walker w/seat and toilet riser.   Anticipated DC Date:  04/09/2012   Anticipated DC Plan:  HOME W HOME HEALTH SERVICES  In-house referral  Clinical Social Worker      DC Planning Services  CM consult      PAC Choice  DURABLE MEDICAL EQUIPMENT  HOME HEALTH   Choice offered to / List presented to:  C-4 Adult Children   DME arranged  WALKER - Lavone Nian      DME agency  [facility]     HH arranged  HH-2 PT      [facility] agency  [facility]   Status of service:  Completed, signed off Medicare Important Message given?  NA - LOS <3 / Initial given by admissions (If response is "NO", the following Medicare IM given date fields will be blank) Date Medicare IM given:   Date Additional Medicare IM given:    Discharge Disposition:  HOME W HOME HEALTH SERVICES  Per UR Regulation:    If discussed at Long Length of Stay Meetings, dates discussed:    Comments:  04/08/2012 Colleen Can BSN RN CCM [phone] Pt discharged today. [facility] willl start [facility] services tomorrow 04/09/2012.  04/07/2012 Colleen Can BSN RN CCM [phone] Physical therapy has recommended RW. Pt  states she does need one. [facility] notified of dme need. Patient would like [facility] agency that is in network. [facility] called for service request. Spoke with [facility] rep who states [place] office can provide HHpt services for patient. Pt advised and given contact information for  [facility]. Caregiver address: Madolyn Frieze [address], [place], [place] [address]

## 2012-04-07 NOTE — Progress Notes (Signed)
Physical Therapy Treatment Patient Details Name: Alyssa [name] MRN: [id number] DOB: [birth year] Today's Date: 04/07/2012 Time: 1133-1206 PT Time Calculation (min): 33 min  PT Assessment / Plan / Recommendation Comments on Treatment Session  pt progressing well    Follow Up Recommendations  Home health PT     Does the patient have the potential to tolerate intense rehabilitation     Barriers to Discharge        Equipment Recommendations  Rolling walker with 5" wheels    Recommendations for Other Services    Frequency 7X/week   Plan Discharge plan remains appropriate;Frequency remains appropriate    Precautions / Restrictions Precautions Precautions: Posterior Hip Restrictions Weight Bearing Restrictions: No   Pertinent Vitals/Pain Denies pain    Mobility  Transfers Transfers: Sit to Stand;Stand to Sit Sit to Stand: 4: Min guard;5: Supervision Stand to Sit: 4: Min guard;5: Supervision Details for Transfer Assistance: cues for THP and hand placement Ambulation/Gait Ambulation/Gait Assistance: 4: Min guard;5: Supervision Ambulation Distance (Feet): 280 Feet Assistive device: Rolling walker Ambulation/Gait Assistance Details: cues for step through and RW safety Gait Pattern: Step-to pattern;Step-through pattern    Exercises Total Joint Exercises Ankle Circles/Pumps: AROM;Both;10 reps Quad Sets: AROM;Both;10 reps   PT Diagnosis:    PT Problem List:   PT Treatment Interventions:     PT Goals Acute Rehab PT Goals Time For Goal Achievement: 04/09/12 Potential to Achieve Goals: Good Pt will go Sit to Stand: with supervision PT Goal: Sit to Stand - Progress: Progressing toward goal Pt will Ambulate: 51 - 150 feet;with rolling walker;with supervision PT Goal: Ambulate - Progress: Progressing toward goal Pt will Perform Home Exercise Program: with supervision, verbal cues required/provided PT Goal: Perform Home Exercise Program - Progress: Progressing toward  goal  Visit Information  Last PT Received On: 04/07/12 Assistance Needed: +1    Subjective Data  Subjective: I feel good   Cognition  Cognition Overall Cognitive Status: Appears within functional limits for tasks assessed/performed Arousal/Alertness: Awake/alert Orientation Level: Appears intact for tasks assessed Behavior During Session: [facility] for tasks performed    Balance     End of Session PT - End of Session Activity Tolerance: Patient tolerated treatment well Patient left: in chair;with call bell/phone within reach;with family/visitor present   GP     [facility] 04/07/2012, 12:11 PM

## 2012-04-07 NOTE — Progress Notes (Signed)
   Subjective: 1 Day Post-Op Procedure(s) (LRB): TOTAL HIP ARTHROPLASTY (Left) Patient reports no pain this morning. Patient seen in rounds with Dr. Lequita Halt. Patient is well, and has had no acute complaints or problems.  She states that she just could not sleep. We will start therapy today.  Plan is to go Home after hospital stay.  Objective: Vital signs in last 24 hours: Temp:  [97.5 F (36.4 C)-98.6 F (37 C)] 98.6 F (37 C) (04/03 0616) Pulse Rate:  [61-93] 88 (04/03 0616) Resp:  [11-16] 16 (04/03 0616) BP: (111-167)/(61-92) 146/70 mmHg (04/03 0616) SpO2:  [96 %-100 %] 100 % (04/03 0616) Weight:  [61.689 kg (136 lb)] 61.689 kg (136 lb) (04/02 1127)  Intake/Output from previous day:  Intake/Output Summary (Last 24 hours) at 04/07/12 0708 Last data filed at 04/07/12 4098  Gross per 24 hour  Intake   4495 ml  Output   3690 ml  Net    805 ml    Intake/Output this shift:    Labs:  Recent Labs  04/07/12 0408  HGB 10.2*    Recent Labs  04/07/12 0408  WBC 10.8*  RBC 3.42*  HCT 29.3*  PLT 189    Recent Labs  04/07/12 0408  NA 135  K 4.3  CL 100  CO2 29  BUN 8  CREATININE 0.62  GLUCOSE 135*  CALCIUM 8.6   No results found for this basename: LABPT, INR,  in the last 72 hours  EXAM General - Patient is Alert, Appropriate and Oriented Extremity - Neurovascular intact Sensation intact distally Dorsiflexion/Plantar flexion intact Dressing - dressing C/D/I Motor Function - intact, moving foot and toes well on exam.  Hemovac pulled without difficulty.  Past Medical History  Diagnosis Date  . Thyroid disease     S/P thyroidectomy  . Hypertension   . High cholesterol     03/23/11 'don't take anything for it"  . Glaucoma, right eye     lens implant done for 4 to  yrs ago  . Arthritis   . Left bundle branch block   . Stroke 03/23/11    "mini strokes; years ago; didn't go to dr;  Peggye Form had babies since then"  . Cancer     "female; not sure if uterus  or endometrosis"  . Cough     occasional  . Tinnitus of both ears     Assessment/Plan: 1 Day Post-Op Procedure(s) (LRB): TOTAL HIP ARTHROPLASTY (Left) Principal Problem:   OA (osteoarthritis) of hip  Estimated body mass index is 24.87 kg/(m^2) as calculated from the following:   Height as of this encounter: 5\' 2"  (1.575 m).   Weight as of this encounter: 61.689 kg (136 lb). Advance diet Up with therapy Plan for discharge tomorrow Discharge home with home health  DVT Prophylaxis - Xarelto Weight Bearing As Tolerated left Leg D/C Knee Immobilizer Hemovac Pulled Begin Therapy Hip Preacutions No vaccines.  Kraven Calk 04/07/2012, 7:08 AM

## 2012-04-08 DIAGNOSIS — D62 Acute posthemorrhagic anemia: Secondary | ICD-10-CM

## 2012-04-08 LAB — CBC
HCT: 27.4 % — ABNORMAL LOW (ref 36.0–46.0)
MCV: 85.1 fL (ref 78.0–100.0)
RDW: 13.2 % (ref 11.5–15.5)
WBC: 12.8 10*3/uL — ABNORMAL HIGH (ref 4.0–10.5)

## 2012-04-08 LAB — BASIC METABOLIC PANEL
BUN: 14 mg/dL (ref 6–23)
Chloride: 101 mEq/L (ref 96–112)
Creatinine, Ser: 0.57 mg/dL (ref 0.50–1.10)
GFR calc Af Amer: >90 mL/min (ref >90)

## 2012-04-08 MED ORDER — FLEET ENEMA 7-19 GM/118ML RE ENEM
1.0000 | ENEMA | Freq: Every day | RECTAL | Status: DC | PRN
  Filled 2012-04-08: qty 1

## 2012-04-08 MED ORDER — RIVAROXABAN 10 MG PO TABS: 10.0000 mg | ORAL_TABLET | Freq: Every day | ORAL | Status: DC

## 2012-04-08 MED ORDER — TRAMADOL HCL 50 MG PO TABS: 50.0000 mg | ORAL_TABLET | Freq: Four times a day (QID) | ORAL | Status: DC | PRN

## 2012-04-08 MED ORDER — METHOCARBAMOL 500 MG PO TABS: 500.0000 mg | ORAL_TABLET | Freq: Four times a day (QID) | ORAL | Status: DC | PRN

## 2012-04-08 NOTE — Discharge Summary (Signed)
Physician Discharge Summary   Patient ID: KYELLE URBAS MRN: [id number] DOB/AGE: Jul 28, 1927 79 y.o.  Admit date: 04/06/2012 Discharge date: 04/08/2012  Primary Diagnosis:  Osteoarthritis Left hip  Admission Diagnoses:  Past Medical History  Diagnosis Date  . Thyroid disease     S/P thyroidectomy  . Hypertension   . High cholesterol     03/23/11 'don't take anything for it"  . Glaucoma, right eye     lens implant done for 4 to  yrs ago  . Arthritis   . Left bundle branch block   . Stroke 03/23/11    "mini strokes; years ago; didn't go to dr;  Peggye Form had babies since then"  . Cancer     "female; not sure if uterus or endometrosis"  . Cough     occasional  . Tinnitus of both ears    Discharge Diagnoses:   Principal Problem:   OA (osteoarthritis) of hip Active Problems:   Postoperative anemia due to acute blood loss  Estimated body mass index is 24.87 kg/(m^2) as calculated from the following:   Height as of this encounter: 5\' 2"  (1.575 m).   Weight as of this encounter: 61.689 kg (136 lb).  Procedure(s) (LRB): TOTAL HIP ARTHROPLASTY (Left)   Consults: None  HPI: Alyssa [name] is a 79 y.o. female with end stage arthritis of her left hip with progressively worsening pain and dysfunction. Pain occurs with activity and rest including pain at night. She has tried analgesics, protected weight bearing and rest without benefit. Pain is too severe to attempt physical therapy. Radiographs demonstrate bone on bone arthritis with subchondral cyst formation. She presents now for left THA.  Laboratory Data: Admission on 04/06/2012  Component Date Value Range Status  . WBC 04/07/2012 10.8* 4.0 - 10.5 K/uL Final  . RBC 04/07/2012 3.42* 3.87 - 5.11 MIL/uL Final  . Hemoglobin 04/07/2012 10.2* 12.0 - 15.0 g/dL Final  . HCT 52/84/1324 29.3* 36.0 - 46.0 % Final  . MCV 04/07/2012 85.7  78.0 - 100.0 fL Final  . MCH 04/07/2012 29.8  26.0 - 34.0 pg Final  . MCHC 04/07/2012 34.8  30.0 -  36.0 g/dL Final  . RDW 40/10/2723 13.0  11.5 - 15.5 % Final  . Platelets 04/07/2012 189  150 - 400 K/uL Final  . Sodium 04/07/2012 135  135 - 145 mEq/L Final  . Potassium 04/07/2012 4.3  3.5 - 5.1 mEq/L Final  . Chloride 04/07/2012 100  96 - 112 mEq/L Final  . CO2 04/07/2012 29  19 - 32 mEq/L Final  . Glucose, Bld 04/07/2012 135* 70 - 99 mg/dL Final  . BUN 36/64/4034 8  6 - 23 mg/dL Final  . Creatinine, Ser 04/07/2012 0.62  0.50 - 1.10 mg/dL Final  . Calcium 74/25/9563 8.6  8.4 - 10.5 mg/dL Final  . GFR calc non Af Amer 04/07/2012 81* >90 mL/min Final  . GFR calc Af Amer 04/07/2012 >90  >90 mL/min Final   Comment:                                 The eGFR has been calculated                          using the CKD EPI equation.  This calculation has not been                          validated in all clinical                          situations.                          eGFR's persistently                          <90 mL/min signify                          possible Chronic Kidney Disease.  . WBC 04/08/2012 12.8* 4.0 - 10.5 K/uL Final  . RBC 04/08/2012 3.22* 3.87 - 5.11 MIL/uL Final  . Hemoglobin 04/08/2012 9.1* 12.0 - 15.0 g/dL Final  . HCT 16/10/9602 27.4* 36.0 - 46.0 % Final  . MCV 04/08/2012 85.1  78.0 - 100.0 fL Final  . MCH 04/08/2012 28.3  26.0 - 34.0 pg Final  . MCHC 04/08/2012 33.2  30.0 - 36.0 g/dL Final  . RDW 54/09/8117 13.2  11.5 - 15.5 % Final  . Platelets 04/08/2012 183  150 - 400 K/uL Final  . Sodium 04/08/2012 136  135 - 145 mEq/L Final  . Potassium 04/08/2012 4.0  3.5 - 5.1 mEq/L Final  . Chloride 04/08/2012 101  96 - 112 mEq/L Final  . CO2 04/08/2012 28  19 - 32 mEq/L Final  . Glucose, Bld 04/08/2012 113* 70 - 99 mg/dL Final  . BUN 14/78/2956 14  6 - 23 mg/dL Final  . Creatinine, Ser 04/08/2012 0.57  0.50 - 1.10 mg/dL Final  . Calcium 21/30/8657 8.6  8.4 - 10.5 mg/dL Final  . GFR calc non Af Amer 04/08/2012 83* >90 mL/min Final  . GFR  calc Af Amer 04/08/2012 >90  >90 mL/min Final   Comment:                                 The eGFR has been calculated                          using the CKD EPI equation.                          This calculation has not been                          validated in all clinical                          situations.                          eGFR's persistently                          <90 mL/min signify                          possible Chronic Kidney Disease.  Hospital Outpatient Visit on 03/29/2012  Component  Date Value Range Status  . aPTT 03/29/2012 33  24 - 37 seconds Final  . WBC 03/29/2012 6.8  4.0 - 10.5 K/uL Final  . RBC 03/29/2012 4.58  3.87 - 5.11 MIL/uL Final  . Hemoglobin 03/29/2012 13.0  12.0 - 15.0 g/dL Final  . HCT 16/10/9602 39.5  36.0 - 46.0 % Final  . MCV 03/29/2012 86.2  78.0 - 100.0 fL Final  . MCH 03/29/2012 28.4  26.0 - 34.0 pg Final  . MCHC 03/29/2012 32.9  30.0 - 36.0 g/dL Final  . RDW 54/09/8117 13.0  11.5 - 15.5 % Final  . Platelets 03/29/2012 265  150 - 400 K/uL Final  . Sodium 03/29/2012 136  135 - 145 mEq/L Final  . Potassium 03/29/2012 4.2  3.5 - 5.1 mEq/L Final  . Chloride 03/29/2012 97  96 - 112 mEq/L Final  . CO2 03/29/2012 30  19 - 32 mEq/L Final  . Glucose, Bld 03/29/2012 93  70 - 99 mg/dL Final  . BUN 14/78/2956 8  6 - 23 mg/dL Final  . Creatinine, Ser 03/29/2012 0.68  0.50 - 1.10 mg/dL Final  . Calcium 21/30/8657 9.6  8.4 - 10.5 mg/dL Final  . Total Protein 03/29/2012 7.5  6.0 - 8.3 g/dL Final  . Albumin 84/69/6295 3.8  3.5 - 5.2 g/dL Final  . AST 28/41/3244 18  0 - 37 U/L Final  . ALT 03/29/2012 10  0 - 35 U/L Final  . Alkaline Phosphatase 03/29/2012 101  39 - 117 U/L Final  . Total Bilirubin 03/29/2012 1.0  0.3 - 1.2 mg/dL Final  . GFR calc non Af Amer 03/29/2012 79* >90 mL/min Final  . GFR calc Af Amer 03/29/2012 >90  >90 mL/min Final   Comment:                                 The eGFR has been calculated                          using the  CKD EPI equation.                          This calculation has not been                          validated in all clinical                          situations.                          eGFR's persistently                          <90 mL/min signify                          possible Chronic Kidney Disease.  Marland Kitchen Prothrombin Time 03/29/2012 13.1  11.6 - 15.2 seconds Final  . INR 03/29/2012 1.00  0.00 - 1.49 Final  . ABO/RH(D) 03/29/2012 O NEG   Final  . Antibody Screen 03/29/2012 NEG   Final  . Sample Expiration 03/29/2012 04/09/2012   Final  . Color, Urine 03/29/2012 YELLOW  YELLOW Final  . APPearance 03/29/2012 CLEAR  CLEAR Final  . Specific Gravity, Urine 03/29/2012 1.017  1.005 - 1.030 Final  . pH 03/29/2012 7.0  5.0 - 8.0 Final  . Glucose, UA 03/29/2012 NEGATIVE  NEGATIVE mg/dL Final  . Hgb urine dipstick 03/29/2012 SMALL* NEGATIVE Final  . Bilirubin Urine 03/29/2012 NEGATIVE  NEGATIVE Final  . Ketones, ur 03/29/2012 NEGATIVE  NEGATIVE mg/dL Final  . Protein, ur 65/78/4696 NEGATIVE  NEGATIVE mg/dL Final  . Urobilinogen, UA 03/29/2012 0.2  0.0 - 1.0 mg/dL Final  . Nitrite 29/52/8413 NEGATIVE  NEGATIVE Final  . Leukocytes, UA 03/29/2012 NEGATIVE  NEGATIVE Final  . MRSA, PCR 03/29/2012 NEGATIVE  NEGATIVE Final  . Staphylococcus aureus 03/29/2012 NEGATIVE  NEGATIVE Final   Comment:                                 The Xpert SA Assay (FDA                          approved for NASAL specimens                          in patients over 51 years of age),                          is one component of                          a comprehensive surveillance                          program.  Test performance has                          been validated by [employer] for patients greater                          than or equal to 74 year old.                          It is not intended                          to diagnose infection nor to                          guide  or monitor treatment.  . Squamous Epithelial / LPF 03/29/2012 RARE  RARE Final  . RBC / HPF 03/29/2012 0-2  <3 RBC/hpf Final  . Urine-Other 03/29/2012 MUCOUS PRESENT   Final  . ABO/RH(D) 03/29/2012 O NEG   Final     X-Rays:Dg Chest 2 View  03/29/2012  *RADIOLOGY REPORT*  Clinical Data:  non smoker  CHEST - 2 VIEW  Comparison: None.  Findings: Heart size is normal.  No pleural effusion or edema.  No airspace consolidation.  There is a mild curvature of the thoracic spine.  IMPRESSION:  1.  No acute cardiopulmonary abnormalities.   Original Report Authenticated By: Signa Kell, M.D.  Dg Hip Complete Left  03/29/2012  *RADIOLOGY REPORT*  Clinical Data: Osteoarthritic changes; preoperative for left total hip replacement  LEFT HIP - COMPLETE 2+ VIEW  Comparison: June 06, 2009  Findings: Frontal pelvis as well as frontal and lateral left hip images were obtained.  There is advanced osteoarthritis of the left hip joint with what appears to be avascular necrosis in the left femoral head.  There is complete loss of disc space along the more superior aspect with bony eburnation on both sides of the joint and there is no fracture or dislocation.  There are is moderate osteoarthritic change on the right, stable from prior study.  Overall, bones are osteoporotic.  IMPRESSION:  Advanced osteoarthritic change left hip joint with avascular necrosis left femoral head.  Moderate osteoarthritic change right hip region. Note that the bones overall are somewhat osteoporotic. No destructive type lesions are identified.  No fracture or dislocation.   Original Report Authenticated By: Bretta Bang, M.D.    Dg Pelvis Portable  04/06/2012  *RADIOLOGY REPORT*  Clinical Data: Postop left hip replacement  PORTABLE PELVIS  Comparison: 06/06/2009  Findings: Portable AP view the pelvis shows the patient be status post left total hip arthroplasty.  The entire femoral component has not been visualized.  Gas in the soft tissues of  the left hip is compatible with immediate postoperative state and a surgical drain is noted in the proximal thigh.  Bones are diffusely demineralized.  IMPRESSION: Status post left total hip replacement evidence for immediate hardware complications although the entire femoral component of the prosthesis is not visualized.   Original Report Authenticated By: Kennith Center, M.D.    Dg Hip Portable 1 View Left  04/06/2012  *RADIOLOGY REPORT*  Clinical Data: Postop from left hip surgery.  PORTABLE LEFT HIP - 1 VIEW  Comparison: 03/29/2012  Findings: Portable AP view of the left hip shows the patient be status post a left total hip replacement.  No evidence for immediate hardware complications.  Gas in the soft tissues around the left hip and the surgical drain over the proximal thigh are compatible with the immediate postoperative state.  IMPRESSION: Status post left total hip replacement without evidence for immediate hardware complications.   Original Report Authenticated By: Kennith Center, M.D.     EKG: Orders placed during the hospital encounter of 03/23/11  . EKG     Hospital Course: Patient was admitted to [facility] and taken to the OR and underwent the above state procedure without complications.  Patient tolerated the procedure well and was later transferred to the recovery room and then to the orthopaedic floor for postoperative care.  They were given PO and IV analgesics for pain control following their surgery.  They were given 24 hours of postoperative antibiotics of  Anti-infectives   Start     Dose/Rate Route Frequency Ordered Stop   04/06/12 2000  vancomycin (VANCOCIN) IVPB 1000 mg/200 mL premix     1,000 mg 200 mL/hr over 60 Minutes Intravenous Every 12 hours 04/06/12 1333 04/06/12 2122   04/06/12 0630  vancomycin (VANCOCIN) IVPB 1000 mg/200 mL premix     1,000 mg 200 mL/hr over 60 Minutes Intravenous On call to O.R. 04/06/12 1610 04/06/12 0800     and started on DVT  prophylaxis in the form of Xarelto.   PT and OT were ordered for total hip protocol.  The patient was allowed to be WBAT with therapy. Discharge planning was consulted to help with postop disposition  and equipment needs.  Patient had a great night on the evening of surgery.  They started to get up OOB with therapy on day one.  Hemovac drain was pulled without difficulty.  The knee immobilizer was removed and discontinued.  Continued to work with therapy into day two.  Dressing was changed on day two and the incision was healing well. Patient was seen in rounds and was ready to go home later that same day.   Discharge Medications: Prior to Admission medications   Medication Sig Start Date End Date Taking? Authorizing Provider  lisinopril (PRINIVIL,ZESTRIL) 10 MG tablet Take 5 mg by mouth daily at 12 noon.   Yes Historical Provider, MD  Hypromellose (NATURAL BALANCE TEARS) 0.4 % SOLN Apply 1 drop to eye daily as needed (for dry eyes).    Historical Provider, MD  methocarbamol (ROBAXIN) 500 MG tablet Take 1 tablet (500 mg total) by mouth every 6 (six) hours as needed. 04/08/12   Alexzandrew Julien Girt, PA-C  rivaroxaban (XARELTO) 10 MG TABS tablet Take 1 tablet (10 mg total) by mouth daily with breakfast. Take Xarelto for two and a half more weeks, then discontinue Xarelto. 04/08/12   Alexzandrew Perkins, PA-C  traMADol (ULTRAM) 50 MG tablet Take 1-2 tablets (50-100 mg total) by mouth every 6 (six) hours as needed. 04/08/12   Alexzandrew Julien Girt, PA-C    Diet: Cardiac diet Activity:WBAT No bending hip over 90 degrees- A "L" Angle Do not cross legs Do not let foot roll inward When turning these patients a pillow should be placed between the patient's legs to prevent crossing. Patients should have the affected knee fully extended when trying to sit or stand from all surfaces to prevent excessive hip flexion. When ambulating and turning toward the affected side the affected leg should have the toes turned out  prior to moving the walker and the rest of patient's body as to prevent internal rotation/ turning in of the leg. Abduction pillows are the most effective way to prevent a patient from not crossing legs or turning toes in at rest. If an abduction pillow is not ordered placing a regular pillow length wise between the patient's legs is also an effective reminder. It is imperative that these precautions be maintained so that the surgical hip does not dislocate. Follow-up:in 2 weeks Disposition - Home Discharged Condition: good   Discharge Orders   Future Orders Complete By Expires     Call MD / Call 911  As directed     Comments:      If you experience chest pain or shortness of breath, CALL 911 and be transported to the hospital emergency room.  If you develope a fever above 101 F, pus (white drainage) or increased drainage or redness at the wound, or calf pain, call your surgeon's office.    Change dressing  As directed     Comments:      You may change your dressing dressing daily with sterile 4 x 4 inch gauze dressing and paper tape.  Do not submerge the incision under water.    Constipation Prevention  As directed     Comments:      Drink plenty of fluids.  Prune juice may be helpful.  You may use a stool softener, such as Colace (over the counter) 100 mg twice a day.  Use MiraLax (over the counter) for constipation as needed.    Diet - low sodium heart healthy  As directed     Discharge instructions  As directed     Comments:      Pick up stool softner and laxative for home. Do not submerge incision under water. May shower. Continue to use ice for pain and swelling from surgery. Hip precautions.  Total Hip Protocol.  Take Xarelto for two and a half more weeks, then discontinue Xarelto.    Do not sit on low chairs, stoools or toilet seats, as it may be difficult to get up from low surfaces  As directed     Driving restrictions  As directed     Comments:      No driving until  released by the physician.    Follow the hip precautions as taught in Physical Therapy  As directed     Increase activity slowly as tolerated  As directed     Lifting restrictions  As directed     Comments:      No lifting until released by the physician.    Patient may shower  As directed     Comments:      You may shower without a dressing once there is no drainage.  Do not wash over the wound.  If drainage remains, do not shower until drainage stops.    TED hose  As directed     Comments:      Use stockings (TED hose) for 3 weeks on both leg(s).  You may remove them at night for sleeping.    Weight bearing as tolerated  As directed         Medication List    STOP taking these medications       fish oil-omega-3 fatty acids 1000 MG capsule      TAKE these medications       lisinopril 10 MG tablet  Commonly known as:  PRINIVIL,ZESTRIL  Take 5 mg by mouth daily at 12 noon.     methocarbamol 500 MG tablet  Commonly known as:  ROBAXIN  Take 1 tablet (500 mg total) by mouth every 6 (six) hours as needed.     NATURAL BALANCE TEARS 0.4 % Soln  Generic drug:  Hypromellose  Apply 1 drop to eye daily as needed (for dry eyes).     rivaroxaban 10 MG Tabs tablet  Commonly known as:  XARELTO  Take 1 tablet (10 mg total) by mouth daily with breakfast. Take Xarelto for two and a half more weeks, then discontinue Xarelto.     traMADol 50 MG tablet  Commonly known as:  ULTRAM  Take 1-2 tablets (50-100 mg total) by mouth every 6 (six) hours as needed.           Follow-up Information   Follow up with Loanne Drilling, MD. Schedule an appointment as soon as possible for a visit in 2 weeks.   Contact information:   [address] [place] [address] [phone]       Signed: Patrica Duel 04/08/2012, 8:01 AM

## 2012-04-08 NOTE — Progress Notes (Signed)
Physical Therapy Treatment Patient Details Name: Alyssa [name] MRN: [id number] DOB: 1927/08/15 Today's Date: 04/08/2012 Time: 1000-1026 PT Time Calculation (min): 26 min  PT Assessment / Plan / Recommendation Comments on Treatment Session  pt doing great    Follow Up Recommendations  Home health PT     Does the patient have the potential to tolerate intense rehabilitation     Barriers to Discharge        Equipment Recommendations  Rolling walker with 5" wheels    Recommendations for Other Services    Frequency 7X/week   Plan Discharge plan remains appropriate;Frequency remains appropriate    Precautions / Restrictions Precautions Precautions: Posterior Hip Precaution Comments: reviewed THP; pt and dtr able to verbalize 3/3 Restrictions Weight Bearing Restrictions: No   Pertinent Vitals/Pain Denies pain    Mobility  Transfers Transfers: Sit to Stand;Stand to Sit Sit to Stand: 5: Supervision;6: Modified independent (Device/Increase time) Stand to Sit: 5: Supervision;6: Modified independent (Device/Increase time) Details for Transfer Assistance: cues for THP and hand placement initally; dtr I with cues Ambulation/Gait Ambulation/Gait Assistance: 4: Min guard Ambulation Distance (Feet): 300 Feet Assistive device: Straight cane;Large base quad cane Ambulation/Gait Assistance Details: cues for upward gaze, correct sequencing with cane Gait Pattern: Step-to pattern;Step-through pattern Stairs: Yes Stairs Assistance: 4: Min guard Stair Management Technique: One rail Right;With cane;Forwards;Step to pattern Number of Stairs: 4    Exercises Total Joint Exercises Ankle Circles/Pumps: AROM;Both;10 reps Quad Sets: AROM;Both;10 reps Heel Slides: AROM;AAROM;10 reps;Left Hip ABduction/ADduction: AROM;AAROM;Both;10 reps Long Arc Quad: AROM;Both;10 reps   PT Diagnosis:    PT Problem List:   PT Treatment Interventions:     PT Goals Acute Rehab PT Goals Time For Goal  Achievement: 04/09/12 Potential to Achieve Goals: Good Pt will go Sit to Stand: with supervision PT Goal: Sit to Stand - Progress: Met Pt will Ambulate: 51 - 150 feet;with rolling walker;with supervision PT Goal: Ambulate - Progress: Met Pt will Go Up / Down Stairs: 3-5 stairs;with min assist;with rail(s);with least restrictive assistive device PT Goal: Up/Down Stairs - Progress: Met Pt will Perform Home Exercise Program: with supervision, verbal cues required/provided PT Goal: Perform Home Exercise Program - Progress: Partly met  Visit Information  Last PT Received On: 04/08/12 Assistance Needed: +1    Subjective Data  Subjective: pt dressed and anxious to go home, dtr present Patient Stated Goal: home with dtr   Cognition  Cognition Overall Cognitive Status: Appears within functional limits for tasks assessed/performed Arousal/Alertness: Awake/alert Orientation Level: Appears intact for tasks assessed Behavior During Session: [facility] for tasks performed    Balance     End of Session PT - End of Session Equipment Utilized During Treatment: Gait belt Activity Tolerance: Patient tolerated treatment well Patient left: in chair;with call bell/phone within reach;with family/visitor present   GP     [facility] 04/08/2012, 10:33 AM

## 2012-04-08 NOTE — Evaluation (Signed)
Occupational Therapy Evaluation Patient Details Name: Alyssa [name] MRN: [id number] DOB: 1927-10-03 Today's Date: 04/08/2012 Time: 1478-2956 OT Time Calculation (min): 24 min  OT Assessment / Plan / Recommendation Clinical Impression  Pt is a 79 year old s/p L hip replacement and is doing very well. All education completed with pt and daughter. No follow.    OT Assessment  Patient does not need any further OT services    Follow Up Recommendations  No OT follow up;Supervision/Assistance - 24 hour    Barriers to Discharge      Equipment Recommendations  None recommended by OT    Recommendations for Other Services    Frequency       Precautions / Restrictions Precautions Precautions: Posterior Hip Precaution Comments: reviewed all precautions with pt and daughter. Pt able to state 2/3 at start of session with min question cues for 2 of them. Restrictions Weight Bearing Restrictions: No        ADL  Eating/Feeding: Independent Where Assessed - Eating/Feeding: Chair Grooming: Wash/dry hands;Set up Where Assessed - Grooming: Supported sitting Upper Body Bathing: Chest;Right arm;Left arm;Abdomen;Set up Where Assessed - Upper Body Bathing: Unsupported sitting Lower Body Bathing: Moderate assistance Where Assessed - Lower Body Bathing: Supported sit to stand Upper Body Dressing: Set up Where Assessed - Upper Body Dressing: Unsupported sitting Lower Body Dressing: Maximal assistance Where Assessed - Lower Body Dressing: Supported sit to stand Toilet Transfer: Hydrographic surveyor Method: Other (comment) (up from EOB around end of bed to chair) Toileting - Clothing Manipulation and Hygiene: Minimal assistance Where Assessed - Engineer, mining and Hygiene: Standing Tub/Shower Transfer: Minimal assistance Equipment Used: Rolling walker ADL Comments: Educated on all AE but pt states her daughter will be there to assist her. Pt doing well and all education  completed. Reviewed all hip precautions with pt and how it applies to her ADL.    OT Diagnosis:    OT Problem List:   OT Treatment Interventions:     OT Goals    Visit Information  Last OT Received On: 04/08/12 Assistance Needed: +1    Subjective Data  Subjective: I can get dressed then Patient Stated Goal: home   Prior Functioning               Vision/Perception     Cognition  Cognition Overall Cognitive Status: Appears within functional limits for tasks assessed/performed Arousal/Alertness: Awake/alert Orientation Level: Appears intact for tasks assessed Behavior During Session: [facility] for tasks performed    Extremity/Trunk Assessment Right Upper Extremity Assessment RUE ROM/Strength/Tone: Within functional levels Left Upper Extremity Assessment LUE ROM/Strength/Tone: Within functional levels     Mobility Bed Mobility Bed Mobility: Supine to Sit Supine to Sit: 4: Min guard Details for Bed Mobility Assistance: min verbal cues for technique Transfers Transfers: Sit to Stand;Stand to Sit Sit to Stand: 4: Min guard;With upper extremity assist;From bed Stand to Sit: 4: Min guard;With upper extremity assist;To chair/3-in-1 Details for Transfer Assistance: min verbal cues for safety, hand placement     Exercise Total Joint Exercises Ankle Circles/Pumps: AROM;Both;10 reps Quad Sets: AROM;Both;10 reps Heel Slides: AROM;AAROM;10 reps;Left Hip ABduction/ADduction: AROM;AAROM;Both;10 reps Long Arc Quad: AROM;Both;10 reps   Balance Balance Balance Assessed: Yes Dynamic Standing Balance Dynamic Standing - Level of Assistance: 4: Min assist (to pull up pants in standing)   End of Session OT - End of Session Activity Tolerance: Patient tolerated treatment well Patient left: in chair;with call bell/phone within reach;with family/visitor present  GO  Lennox Laity [phone] 04/08/2012, 12:04 PM

## 2012-04-08 NOTE — Progress Notes (Signed)
   Subjective: 2 Days Post-Op Procedure(s) (LRB): TOTAL HIP ARTHROPLASTY (Left) Patient reports no pain today. Patient seen in rounds with Dr. Lequita Halt.  Daughter in room. Patient is well, and has had no acute complaints or problems Patient is ready to go home today.  Objective: Vital signs in last 24 hours: Temp:  [98.2 F (36.8 C)-98.9 F (37.2 C)] 98.2 F (36.8 C) (04/04 0702) Pulse Rate:  [87-89] 87 (04/04 0702) Resp:  [14-20] 14 (04/04 0702) BP: (110-139)/(57-72) 139/67 mmHg (04/04 0702) SpO2:  [96 %-100 %] 97 % (04/04 0702)  Intake/Output from previous day:  Intake/Output Summary (Last 24 hours) at 04/08/12 0755 Last data filed at 04/08/12 4098  Gross per 24 hour  Intake    600 ml  Output   3500 ml  Net  -2900 ml    Intake/Output this shift: Total I/O In: -  Out: 350 [Urine:350]  Labs:  Recent Labs  04/07/12 0408 04/08/12 0415  HGB 10.2* 9.1*    Recent Labs  04/07/12 0408 04/08/12 0415  WBC 10.8* 12.8*  RBC 3.42* 3.22*  HCT 29.3* 27.4*  PLT 189 183    Recent Labs  04/07/12 0408 04/08/12 0415  NA 135 136  K 4.3 4.0  CL 100 101  CO2 29 28  BUN 8 14  CREATININE 0.62 0.57  GLUCOSE 135* 113*  CALCIUM 8.6 8.6   No results found for this basename: LABPT, INR,  in the last 72 hours  EXAM: General - Patient is Alert, Appropriate and Oriented Extremity - Neurovascular intact Sensation intact distally Dorsiflexion/Plantar flexion intact No cellulitis present Incision - clean, dry, no drainage, healing Motor Function - intact, moving foot and toes well on exam.   Assessment/Plan: 2 Days Post-Op Procedure(s) (LRB): TOTAL HIP ARTHROPLASTY (Left) Procedure(s) (LRB): TOTAL HIP ARTHROPLASTY (Left) Past Medical History  Diagnosis Date  . Thyroid disease     S/P thyroidectomy  . Hypertension   . High cholesterol     03/23/11 'don't take anything for it"  . Glaucoma, right eye     lens implant done for 4 to  yrs ago  . Arthritis   . Left  bundle branch block   . Stroke 03/23/11    "mini strokes; years ago; didn't go to dr;  Peggye Form had babies since then"  . Cancer     "female; not sure if uterus or endometrosis"  . Cough     occasional  . Tinnitus of both ears    Principal Problem:   OA (osteoarthritis) of hip Active Problems:   Postoperative anemia due to acute blood loss  Estimated body mass index is 24.87 kg/(m^2) as calculated from the following:   Height as of this encounter: 5\' 2"  (1.575 m).   Weight as of this encounter: 61.689 kg (136 lb). Up with therapy Discharge home with home health Diet - Cardiac diet Follow up - in 2 weeks Activity - WBAT Disposition - Home Condition Upon Discharge - Good D/C Meds - See DC Summary DVT Prophylaxis - Xarelto  Arlene Genova 04/08/2012, 7:55 AM

## 2014-04-10 ENCOUNTER — Other Ambulatory Visit: Payer: Self-pay | Admitting: Dermatology

## 2019-06-26 ENCOUNTER — Other Ambulatory Visit: Payer: Self-pay | Admitting: *Deleted

## 2019-06-26 DIAGNOSIS — I83893 Varicose veins of bilateral lower extremities with other complications: Secondary | ICD-10-CM

## 2019-07-05 ENCOUNTER — Ambulatory Visit: Payer: Self-pay | Admitting: Vascular Surgery

## 2019-07-05 ENCOUNTER — Encounter (HOSPITAL_COMMUNITY): Payer: Self-pay

## 2021-02-05 ENCOUNTER — Inpatient Hospital Stay (HOSPITAL_COMMUNITY): Payer: Medicare Other

## 2021-02-05 ENCOUNTER — Encounter (HOSPITAL_COMMUNITY)
Admission: EM | Disposition: A | Discharge status: Home Health | Payer: Self-pay | Source: Home / Self Care | Attending: Family Medicine

## 2021-02-05 ENCOUNTER — Emergency Department (HOSPITAL_COMMUNITY): Payer: Medicare Other

## 2021-02-05 ENCOUNTER — Encounter (HOSPITAL_COMMUNITY): Payer: Self-pay

## 2021-02-05 ENCOUNTER — Inpatient Hospital Stay (HOSPITAL_COMMUNITY)
Admission: EM | Admit: 2021-02-05 | Discharge: 2021-02-07 | DRG: 482 | Disposition: A | Discharge status: Home Health | Payer: Medicare Other | Attending: Family Medicine | Admitting: Family Medicine

## 2021-02-05 ENCOUNTER — Inpatient Hospital Stay (HOSPITAL_COMMUNITY): Payer: Medicare Other | Admitting: Anesthesiology

## 2021-02-05 DIAGNOSIS — S72001A Fracture of unspecified part of neck of right femur, initial encounter for closed fracture: Secondary | ICD-10-CM

## 2021-02-05 DIAGNOSIS — Z90710 Acquired absence of both cervix and uterus: Secondary | ICD-10-CM

## 2021-02-05 DIAGNOSIS — I10 Essential (primary) hypertension: Secondary | ICD-10-CM | POA: Diagnosis present

## 2021-02-05 DIAGNOSIS — Z20822 Contact with and (suspected) exposure to covid-19: Secondary | ICD-10-CM | POA: Diagnosis present

## 2021-02-05 DIAGNOSIS — Z87891 Personal history of nicotine dependence: Secondary | ICD-10-CM | POA: Diagnosis not present

## 2021-02-05 DIAGNOSIS — W109XXA Fall (on) (from) unspecified stairs and steps, initial encounter: Secondary | ICD-10-CM | POA: Diagnosis present

## 2021-02-05 DIAGNOSIS — Z7901 Long term (current) use of anticoagulants: Secondary | ICD-10-CM

## 2021-02-05 DIAGNOSIS — Z79891 Long term (current) use of opiate analgesic: Secondary | ICD-10-CM

## 2021-02-05 DIAGNOSIS — Z79899 Other long term (current) drug therapy: Secondary | ICD-10-CM | POA: Diagnosis not present

## 2021-02-05 DIAGNOSIS — M1990 Unspecified osteoarthritis, unspecified site: Secondary | ICD-10-CM | POA: Diagnosis present

## 2021-02-05 DIAGNOSIS — E890 Postprocedural hypothyroidism: Secondary | ICD-10-CM | POA: Diagnosis present

## 2021-02-05 DIAGNOSIS — Z880 Allergy status to penicillin: Secondary | ICD-10-CM

## 2021-02-05 DIAGNOSIS — H3530 Unspecified macular degeneration: Secondary | ICD-10-CM | POA: Diagnosis present

## 2021-02-05 DIAGNOSIS — E7800 Pure hypercholesterolemia, unspecified: Secondary | ICD-10-CM | POA: Diagnosis present

## 2021-02-05 DIAGNOSIS — Z96642 Presence of left artificial hip joint: Secondary | ICD-10-CM | POA: Diagnosis present

## 2021-02-05 DIAGNOSIS — Z961 Presence of intraocular lens: Secondary | ICD-10-CM | POA: Diagnosis present

## 2021-02-05 DIAGNOSIS — S72011A Unspecified intracapsular fracture of right femur, initial encounter for closed fracture: Secondary | ICD-10-CM | POA: Diagnosis present

## 2021-02-05 DIAGNOSIS — Z8673 Personal history of transient ischemic attack (TIA), and cerebral infarction without residual deficits: Secondary | ICD-10-CM | POA: Diagnosis not present

## 2021-02-05 DIAGNOSIS — W19XXXA Unspecified fall, initial encounter: Secondary | ICD-10-CM

## 2021-02-05 DIAGNOSIS — Z419 Encounter for procedure for purposes other than remedying health state, unspecified: Secondary | ICD-10-CM

## 2021-02-05 HISTORY — PX: HIP PINNING,CANNULATED: SHX1758

## 2021-02-05 LAB — CBC WITH DIFFERENTIAL/PLATELET
Abs Immature Granulocytes: 0.08 K/uL — ABNORMAL HIGH (ref 0.00–0.07)
Basophils Absolute: 0.1 K/uL (ref 0.0–0.1)
Basophils Relative: 1 %
Eosinophils Absolute: 0.1 K/uL (ref 0.0–0.5)
Eosinophils Relative: 1 %
HCT: 38.2 % (ref 36.0–46.0)
Hemoglobin: 12.4 g/dL (ref 12.0–15.0)
Immature Granulocytes: 1 %
Lymphocytes Relative: 9 %
Lymphs Abs: 1.2 K/uL (ref 0.7–4.0)
MCH: 28.7 pg (ref 26.0–34.0)
MCHC: 32.5 g/dL (ref 30.0–36.0)
MCV: 88.4 fL (ref 80.0–100.0)
Monocytes Absolute: 0.8 K/uL (ref 0.1–1.0)
Monocytes Relative: 5 %
Neutro Abs: 11.6 K/uL — ABNORMAL HIGH (ref 1.7–7.7)
Neutrophils Relative %: 83 %
Platelets: 215 K/uL (ref 150–400)
RBC: 4.32 MIL/uL (ref 3.87–5.11)
RDW: 13.1 % (ref 11.5–15.5)
WBC: 13.8 K/uL — ABNORMAL HIGH (ref 4.0–10.5)
nRBC: 0 % (ref 0.0–0.2)

## 2021-02-05 LAB — TYPE AND SCREEN
ABO/RH(D): O NEG
Antibody Screen: NEGATIVE

## 2021-02-05 LAB — BASIC METABOLIC PANEL
Anion gap: 8 (ref 5–15)
BUN: 13 mg/dL (ref 8–23)
CO2: 26 mmol/L (ref 22–32)
Calcium: 8.6 mg/dL — ABNORMAL LOW (ref 8.9–10.3)
Chloride: 102 mmol/L (ref 98–111)
Creatinine, Ser: 0.7 mg/dL (ref 0.44–1.00)
GFR, Estimated: >60 mL/min (ref >60)
Glucose, Bld: 95 mg/dL (ref 70–99)
Potassium: 4.3 mmol/L (ref 3.5–5.1)
Sodium: 136 mmol/L (ref 135–145)

## 2021-02-05 LAB — SURGICAL PCR SCREEN
MRSA, PCR: NEGATIVE
Staphylococcus aureus: NEGATIVE

## 2021-02-05 LAB — RESP PANEL BY RT-PCR (FLU A&B, COVID) ARPGX2
Influenza A by PCR: NEGATIVE
Influenza B by PCR: NEGATIVE
SARS Coronavirus 2 by RT PCR: NEGATIVE

## 2021-02-05 SURGERY — FIXATION, FEMUR, NECK, PERCUTANEOUS, USING SCREW
Anesthesia: General | Site: Hip | Laterality: Right

## 2021-02-05 MED ORDER — FENTANYL CITRATE (PF) 100 MCG/2ML IJ SOLN
INTRAMUSCULAR | Status: AC
  Filled 2021-02-05: qty 2

## 2021-02-05 MED ORDER — ONDANSETRON HCL 4 MG/2ML IJ SOLN
INTRAMUSCULAR | Status: DC | PRN
  Administered 2021-02-05 (×2): 4 mg via INTRAVENOUS

## 2021-02-05 MED ORDER — ENOXAPARIN SODIUM 40 MG/0.4ML IJ SOSY
40.0000 mg | PREFILLED_SYRINGE | INTRAMUSCULAR | Status: DC
  Administered 2021-02-06 – 2021-02-07 (×2): 40 mg via SUBCUTANEOUS
  Filled 2021-02-05 (×2): qty 0.4

## 2021-02-05 MED ORDER — CIPROFLOXACIN IN D5W 200 MG/100ML IV SOLN
200.0000 mg | Freq: Two times a day (BID) | INTRAVENOUS | Status: AC
  Administered 2021-02-06 (×2): 200 mg via INTRAVENOUS
  Filled 2021-02-05 (×3): qty 100

## 2021-02-05 MED ORDER — CHLORHEXIDINE GLUCONATE 0.12 % MT SOLN
15.0000 mL | OROMUCOSAL | Status: AC
  Administered 2021-02-05: 15 mL via OROMUCOSAL

## 2021-02-05 MED ORDER — VANCOMYCIN HCL 750 MG/150ML IV SOLN
750.0000 mg | Freq: Once | INTRAVENOUS | Status: AC
  Administered 2021-02-06: 750 mg via INTRAVENOUS
  Filled 2021-02-05: qty 150

## 2021-02-05 MED ORDER — CIPROFLOXACIN IN D5W 400 MG/200ML IV SOLN
400.0000 mg | Freq: Once | INTRAVENOUS | Status: AC
  Administered 2021-02-05: 400 mg via INTRAVENOUS
  Filled 2021-02-05: qty 200

## 2021-02-05 MED ORDER — ROCURONIUM BROMIDE 10 MG/ML (PF) SYRINGE
PREFILLED_SYRINGE | INTRAVENOUS | Status: DC | PRN
  Administered 2021-02-05: 50 mg via INTRAVENOUS

## 2021-02-05 MED ORDER — ONDANSETRON HCL 4 MG/2ML IJ SOLN
INTRAMUSCULAR | Status: AC
  Filled 2021-02-05: qty 2

## 2021-02-05 MED ORDER — LIDOCAINE HCL (PF) 2 % IJ SOLN
INTRAMUSCULAR | Status: AC
  Filled 2021-02-05: qty 5

## 2021-02-05 MED ORDER — LIDOCAINE 2% (20 MG/ML) 5 ML SYRINGE
INTRAMUSCULAR | Status: DC | PRN
  Administered 2021-02-05: 80 mg via INTRAVENOUS

## 2021-02-05 MED ORDER — LACTATED RINGERS IV SOLN: INTRAVENOUS | Status: DC

## 2021-02-05 MED ORDER — HYDRALAZINE HCL 20 MG/ML IJ SOLN: 10.0000 mg | Freq: Three times a day (TID) | INTRAMUSCULAR | Status: DC | PRN

## 2021-02-05 MED ORDER — ROCURONIUM BROMIDE 10 MG/ML (PF) SYRINGE
PREFILLED_SYRINGE | INTRAVENOUS | Status: AC
  Filled 2021-02-05: qty 10

## 2021-02-05 MED ORDER — FENTANYL CITRATE PF 50 MCG/ML IJ SOSY: 25.0000 ug | PREFILLED_SYRINGE | INTRAMUSCULAR | Status: DC | PRN

## 2021-02-05 MED ORDER — FENTANYL CITRATE (PF) 100 MCG/2ML IJ SOLN
INTRAMUSCULAR | Status: DC | PRN
  Administered 2021-02-05 (×2): 50 ug via INTRAVENOUS

## 2021-02-05 MED ORDER — MORPHINE SULFATE (PF) 2 MG/ML IV SOLN: 0.5000 mg | INTRAVENOUS | Status: DC | PRN

## 2021-02-05 MED ORDER — HYDROCODONE-ACETAMINOPHEN 5-325 MG PO TABS
1.0000 | ORAL_TABLET | Freq: Four times a day (QID) | ORAL | Status: DC | PRN
  Administered 2021-02-06: 1 via ORAL
  Filled 2021-02-05: qty 1

## 2021-02-05 MED ORDER — SUGAMMADEX SODIUM 200 MG/2ML IV SOLN
INTRAVENOUS | Status: DC | PRN
  Administered 2021-02-05: 120 mg via INTRAVENOUS

## 2021-02-05 MED ORDER — ACETAMINOPHEN 10 MG/ML IV SOLN
INTRAVENOUS | Status: AC
  Filled 2021-02-05: qty 100

## 2021-02-05 MED ORDER — ENOXAPARIN SODIUM 30 MG/0.3ML IJ SOSY: 30.0000 mg | PREFILLED_SYRINGE | INTRAMUSCULAR | Status: DC

## 2021-02-05 MED ORDER — CHLORHEXIDINE GLUCONATE 4 % EX LIQD: 60.0000 mL | Freq: Once | CUTANEOUS | Status: DC

## 2021-02-05 MED ORDER — LIP MEDEX EX OINT
TOPICAL_OINTMENT | CUTANEOUS | Status: AC
  Filled 2021-02-05: qty 7

## 2021-02-05 MED ORDER — DEXAMETHASONE SODIUM PHOSPHATE 10 MG/ML IJ SOLN
INTRAMUSCULAR | Status: DC | PRN
  Administered 2021-02-05: 8 mg via INTRAVENOUS

## 2021-02-05 MED ORDER — DEXAMETHASONE SODIUM PHOSPHATE 10 MG/ML IJ SOLN
INTRAMUSCULAR | Status: AC
  Filled 2021-02-05: qty 1

## 2021-02-05 MED ORDER — PHENYLEPHRINE 40 MCG/ML (10ML) SYRINGE FOR IV PUSH (FOR BLOOD PRESSURE SUPPORT)
PREFILLED_SYRINGE | INTRAVENOUS | Status: DC | PRN
  Administered 2021-02-05: 80 ug via INTRAVENOUS

## 2021-02-05 MED ORDER — PROPOFOL 10 MG/ML IV BOLUS
INTRAVENOUS | Status: DC | PRN
Start: 2021-02-05 — End: 2021-02-05
  Administered 2021-02-05: 50 mg via INTRAVENOUS
  Administered 2021-02-05: 100 mg via INTRAVENOUS

## 2021-02-05 MED ORDER — PHENYLEPHRINE HCL-NACL 20-0.9 MG/250ML-% IV SOLN
INTRAVENOUS | Status: DC | PRN
  Administered 2021-02-05: 40 ug/min via INTRAVENOUS

## 2021-02-05 MED ORDER — ACETAMINOPHEN 10 MG/ML IV SOLN: 1000.0000 mg | Freq: Once | INTRAVENOUS | Status: DC | PRN

## 2021-02-05 MED ORDER — POVIDONE-IODINE 10 % EX SWAB
2.0000 "application " | Freq: Once | CUTANEOUS | Status: AC
  Administered 2021-02-05: 2 via TOPICAL

## 2021-02-05 MED ORDER — VANCOMYCIN HCL IN DEXTROSE 1-5 GM/200ML-% IV SOLN
1000.0000 mg | INTRAVENOUS | Status: AC
  Administered 2021-02-05: 1000 mg via INTRAVENOUS
  Filled 2021-02-05: qty 200

## 2021-02-05 MED ORDER — 0.9 % SODIUM CHLORIDE (POUR BTL) OPTIME
TOPICAL | Status: DC | PRN
  Administered 2021-02-05: 1000 mL

## 2021-02-05 SURGICAL SUPPLY — 43 items
BAG COUNTER SPONGE SURGICOUNT (BAG) IMPLANT
BAG SPEC THK2 15X12 ZIP CLS (MISCELLANEOUS) ×1
BAG SPNG CNTER NS LX DISP (BAG)
BAG ZIPLOCK 12X15 (MISCELLANEOUS) ×2 IMPLANT
BIT DRILL 4.8X200 CANN (BIT) ×1 IMPLANT
BNDG GAUZE ELAST 4 BULKY (GAUZE/BANDAGES/DRESSINGS) ×2 IMPLANT
COVER SURGICAL LIGHT HANDLE (MISCELLANEOUS) ×2 IMPLANT
DRAPE C-ARM 42X120 X-RAY (DRAPES) ×1 IMPLANT
DRAPE STERI IOBAN 125X83 (DRAPES) ×2 IMPLANT
DRESSING MEPILEX FLEX 4X4 (GAUZE/BANDAGES/DRESSINGS) IMPLANT
DRSG AQUACEL AG ADV 3.5X 6 (GAUZE/BANDAGES/DRESSINGS) ×1 IMPLANT
DRSG MEPILEX BORDER 4X8 (GAUZE/BANDAGES/DRESSINGS) IMPLANT
DRSG MEPILEX FLEX 4X4 (GAUZE/BANDAGES/DRESSINGS)
DURAPREP 26ML APPLICATOR (WOUND CARE) ×2 IMPLANT
ELECT REM PT RETURN 15FT ADLT (MISCELLANEOUS) ×2 IMPLANT
GLOVE SRG 8 PF TXTR STRL LF DI (GLOVE) ×1 IMPLANT
GLOVE SURG ENC MOIS LTX SZ6.5 (GLOVE) ×4 IMPLANT
GLOVE SURG ENC MOIS LTX SZ8 (GLOVE) ×4 IMPLANT
GLOVE SURG UNDER POLY LF SZ7 (GLOVE) ×4 IMPLANT
GLOVE SURG UNDER POLY LF SZ8 (GLOVE) ×2
GOWN STRL REUS W/TWL LRG LVL3 (GOWN DISPOSABLE) ×4 IMPLANT
HOLDER FOLEY CATH W/STRAP (MISCELLANEOUS) ×1 IMPLANT
KIT BASIN OR (CUSTOM PROCEDURE TRAY) ×2 IMPLANT
KIT TURNOVER KIT A (KITS) ×1 IMPLANT
MANIFOLD NEPTUNE II (INSTRUMENTS) ×2 IMPLANT
NS IRRIG 1000ML POUR BTL (IV SOLUTION) ×2 IMPLANT
PACK GENERAL/GYN (CUSTOM PROCEDURE TRAY) ×2 IMPLANT
PROTECTOR NERVE ULNAR (MISCELLANEOUS) ×2 IMPLANT
SCREW 16MM THREAD 6.5X75MM (Screw) ×1 IMPLANT
SCREW CANN FT 70X6.5 NS (Screw) IMPLANT
SCREW CANNULATED 6.5X70MM (Screw) ×2 IMPLANT
SCREW FULLY THREADED 6.5X80 (Screw) ×1 IMPLANT
STAPLER VISISTAT 35W (STAPLE) ×1 IMPLANT
STRIP CLOSURE SKIN 1/2X4 (GAUZE/BANDAGES/DRESSINGS) ×2 IMPLANT
SUT MNCRL AB 4-0 PS2 18 (SUTURE) ×2 IMPLANT
SUT VIC AB 1 CT1 36 (SUTURE) ×2 IMPLANT
SUT VIC AB 2-0 CT1 27 (SUTURE) ×2
SUT VIC AB 2-0 CT1 TAPERPNT 27 (SUTURE) ×1 IMPLANT
SUT VIC AB 2-0 SH 27 (SUTURE) ×2
SUT VIC AB 2-0 SH 27XBRD (SUTURE) IMPLANT
TOWEL OR 17X26 10 PK STRL BLUE (TOWEL DISPOSABLE) ×2 IMPLANT
TRAY FOLEY MTR SLVR 16FR STAT (SET/KITS/TRAYS/PACK) IMPLANT
WASHER FLAT 6.5MM (Washer) ×2 IMPLANT

## 2021-02-05 NOTE — ED Notes (Signed)
Pt states that she missed a step on the stairs in her garage. She hit her right elbow, right side of head and right leg.  Pt has skin tear on right elbow. Pt denies LOC, states she was able to get up and get back into the house to call for help.

## 2021-02-05 NOTE — H&P (Signed)
History and Physical    Patient: Alyssa [name] MOQ:[id number] DOB: 10/15/1927 DOA: 02/05/2021 DOS: the patient was seen and examined on 02/05/2021 PCP: Ulyses Southward, MD  Patient coming from: Home  Chief Complaint:  Chief Complaint  Patient presents with   Fall    HPI: Alyssa [name] is a 88 y.o. female with medical history significant of macular degeneration. Presenting a fall. She reports that she was descending some steps into her garage this morning when she mis-stepped and fell. She hit her right elbow and hip on the way down. She also hit her head, but did not suffer LOC. She got up with the help of a broom at her side. She climbed the steps and made it to the kitchen. It was painful to try to put weight on her hip, so she continued to support herself with a broom. She called her daughter for help. They decided to come to the ED for assistance. She denies any other aggravating or alleviating factors.    Review of Systems: As mentioned in the history of present illness. All other systems reviewed and are negative. Past Medical History:  Diagnosis Date   Arthritis    Cancer [facility])    "female; not sure if uterus or endometrosis"   Cough    occasional   Glaucoma, right eye    lens implant done for 4 to  yrs ago   High cholesterol    03/23/11 'don't take anything for it"   Hypertension    Left bundle branch block    Stroke ([place]) 03/23/11   "mini strokes; years ago; didn't go to dr;  Rene Paci had babies since then"   Thyroid disease    S/P thyroidectomy   Tinnitus of both ears    Past Surgical History:  Procedure Laterality Date   [place]  ~ [place]  ~ [place]     cadaver bone  2011   right small finger; "took more than cyst out too"   CATARACT EXTRACTION W/ INTRAOCULAR LENS IMPLANT  ~ 2010   right   ENDOVENOUS ABLATION SAPHENOUS VEIN W/ LASER  04/14/2010   left; [facility]   EYE SURGERY     INTRACAPSULAR  CATARACT EXTRACTION  ~ 2010   left   Stab Phlebectomies   06/03/10   R leg; "for varicose veins"   THYROIDECTOMY  ~ 1970's   TONSILLECTOMY  1943   TOTAL HIP ARTHROPLASTY Left 04/06/2012   Procedure: TOTAL HIP ARTHROPLASTY;  Surgeon: Gearlean Alf, MD;  Location: [facility] ORS;  Service: Orthopedics;  Laterality: Left;   TUBAL LIGATION  yrs ago   Social History:  reports that she quit smoking about 51 years ago. Her smoking use included cigarettes. She has never used smokeless tobacco. She reports that she does not drink alcohol and does not use drugs.  Allergies  Allergen Reactions   Penicillins Swelling    "probably 1970; lips and tongue felt but didn't look swollen; dr told me not to take it again"    History reviewed. No pertinent family history.  Prior to Admission medications   Medication Sig Start Date End Date Taking? Authorizing Provider  Hypromellose (NATURAL BALANCE TEARS) 0.4 % SOLN Apply 1 drop to eye daily as needed (for dry eyes).    [provider]  lisinopril (PRINIVIL,ZESTRIL) 10 MG tablet Take 5 mg by mouth daily at 12 noon.    [provider]  methocarbamol (ROBAXIN) 500 MG tablet Take 1 tablet (500 mg total) by mouth every 6 (six) hours as needed. 04/08/12   Perkins, Alexzandrew L, PA-C  rivaroxaban (XARELTO) 10 MG TABS tablet Take 1 tablet (10 mg total) by mouth daily with breakfast. Take Xarelto for two and a half more weeks, then discontinue Xarelto. 04/08/12   Perkins, Alexzandrew L, PA-C  traMADol (ULTRAM) 50 MG tablet Take 1-2 tablets (50-100 mg total) by mouth every 6 (six) hours as needed. 04/08/12   Joelene Millin, PA-C    Physical Exam: Vitals:   02/05/21 1104 02/05/21 1200 02/05/21 1300 02/05/21 1400  BP: (!) 163/78 (!) 163/91 (!) 165/91 (!) 151/94  Pulse: 77 77 80 85  Resp: 18 19 17 17   Temp:      TempSrc:      SpO2: 99% 99% 95% 95%   General: 88 y.o. female resting in bed in NAD Eyes: PERRL, normal sclera ENMT: Nares patent w/o  discharge, orophaynx clear, dentition normal, ears w/o discharge/lesions/ulcers Neck: Supple, trachea midline Cardiovascular: RRR, +S1, S2, no m/g/r, equal pulses throughout Respiratory: CTABL, no w/r/r, normal WOB GI: BS+, NDNT, no masses noted, no organomegaly noted MSK: No e/c/c; limited ROM of right hip d/t pain; abrasion of right elbow Neuro: A&O x 3, no focal deficits Psyc: Appropriate interaction and affect, calm/cooperative   Data Reviewed:  WBC 13.8; likely reactive EKG pending  Assessment and Plan: No notes have been filed under this hospital service. Service: Hospitalist  Right hip fracture     - admit to inpt, med-surg     - orthopedic to take to OR today     - NPO for now     - PT/OT after procedure; TOC consult  HTN     - resume home regimen when confirmed     - will have PRN meds available   Advance Care Planning: FULL  Consults: EDP spoke with Ortho  Family Communication: w/ daughter at bedside  Severity of Illness: The appropriate patient status for this patient is INPATIENT. Inpatient status is judged to be reasonable and necessary in order to provide the required intensity of service to ensure the patient's safety. The patient's presenting symptoms, physical exam findings, and initial radiographic and laboratory data in the context of their chronic comorbidities is felt to place them at high risk for further clinical deterioration. Furthermore, it is not anticipated that the patient will be medically stable for discharge from the hospital within 2 midnights of admission.   * I certify that at the point of admission it is my clinical judgment that the patient will require inpatient hospital care spanning beyond 2 midnights from the point of admission due to high intensity of service, high risk for further deterioration and high frequency of surveillance required.*  Author: Jonnie Finner, DO 02/05/2021 2:37 PM  For on call review [email or web address].

## 2021-02-05 NOTE — Brief Op Note (Signed)
02/05/2021  7:55 PM  PATIENT:  Alyssa [name]  88 y.o. female  PRE-OPERATIVE DIAGNOSIS:  RIGHT FEMORAL NECK FRACTURE  POST-OPERATIVE DIAGNOSIS:  RIGHT FEMORAL NECK FRACTURE  PROCEDURE:  Procedure(s): CANNULATED HIP PINNING (Right)  SURGEON:  Surgeon(s) and Role:    * Armond Hang, MD - Primary  PHYSICIAN ASSISTANT: None  ASSISTANTS: none   ANESTHESIA:   general  EBL:  10 cc   BLOOD ADMINISTERED:none  DRAINS: none   LOCAL MEDICATIONS USED:  NONE  SPECIMEN:  No Specimen  DISPOSITION OF SPECIMEN:  N/A  COUNTS:  YES  TOURNIQUET: None  DICTATION: .Note written in EPIC  PLAN OF CARE: Admit to inpatient   PATIENT DISPOSITION:  PACU - hemodynamically stable.   Delay start of Pharmacological VTE agent (>24hrs) due to surgical blood loss or risk of bleeding: no

## 2021-02-05 NOTE — H&P (Signed)
H&P Update:  -History and Physical Reviewed  -Patient has been re-examined  -No change in the plan of care  -The risks and benefits were presented and reviewed. The risks due to hardware failure/irritation, new/persistent infection, stiffness, nerve/vessel/tendon injury, nonunion/malunion, wound healing issues, development of arthritis, failure of this surgery, possibility of delayed definitive surgery, need for further surgery, thromboembolic events, anesthesia/medical complications, amputation, death among others were discussed. The patient acknowledged the explanation, agreed to proceed with the plan and a consent was signed.  Armond Hang

## 2021-02-05 NOTE — ED Provider Notes (Signed)
Alyssa [name] Provider Note   CSN: [id number] Arrival date & time: 02/05/21  2694     History  Chief Complaint  Patient presents with   Lytle Michaels    Alyssa [name] is a 88 y.o. female.  HPI Patient presents after a fall.  She is here with her daughter who assists with history.  She was in her usual state of health when she missed a step, falling onto her right side.  She hit her head, right elbow, right hip.  Since that time she has been nonambulatory, but awake, alert, no loss of consciousness.  She is not on a blood thinning medication, though she was previously in the perisurgical time period.  She has a history of left hip arthroplasty and hypertension, has been taking her lisinopril as needed. Per daughter, patient is interacting in a typical manner.    Home Medications Prior to Admission medications   Medication Sig Start Date End Date Taking? Authorizing Provider  Hypromellose (NATURAL BALANCE TEARS) 0.4 % SOLN Apply 1 drop to eye daily as needed (for dry eyes).    [provider]  lisinopril (PRINIVIL,ZESTRIL) 10 MG tablet Take 5 mg by mouth daily at 12 noon.    [provider]  methocarbamol (ROBAXIN) 500 MG tablet Take 1 tablet (500 mg total) by mouth every 6 (six) hours as needed. 04/08/12   Perkins, Alexzandrew L, PA-C  rivaroxaban (XARELTO) 10 MG TABS tablet Take 1 tablet (10 mg total) by mouth daily with breakfast. Take Xarelto for two and a half more weeks, then discontinue Xarelto. 04/08/12   Perkins, Alexzandrew L, PA-C  traMADol (ULTRAM) 50 MG tablet Take 1-2 tablets (50-100 mg total) by mouth every 6 (six) hours as needed. 04/08/12   Perkins, Alexzandrew L, PA-C      Allergies    Penicillins    Review of Systems   Review of Systems  Constitutional:        Per HPI, otherwise negative  HENT:         Per HPI, otherwise negative  Respiratory:         Per HPI, otherwise negative  Cardiovascular:        Per HPI,  otherwise negative  Gastrointestinal:  Negative for vomiting.  Endocrine:       Negative aside from HPI  Genitourinary:        Neg aside from HPI   Musculoskeletal:        Per HPI, otherwise negative  Skin: Negative.   Neurological:  Negative for syncope.   Physical Exam Updated Vital Signs BP (!) 151/94    Pulse 85    Temp 97.9 F (36.6 C) (Oral)    Resp 17    SpO2 95%  Physical Exam Vitals and nursing note reviewed.  Constitutional:      General: She is not in acute distress.    Appearance: She is well-developed.  HENT:     Head: Normocephalic and atraumatic.  Eyes:     Conjunctiva/sclera: Conjunctivae normal.  Cardiovascular:     Rate and Rhythm: Normal rate and regular rhythm.  Pulmonary:     Effort: Pulmonary effort is normal. No respiratory distress.     Breath sounds: Normal breath sounds. No stridor.  Abdominal:     General: There is no distension.  Musculoskeletal:       Legs:  Skin:    General: Skin is warm and dry.  Neurological:     Mental Status: She is  alert and oriented to person, place, and time.     Cranial Nerves: No cranial nerve deficit.    ED Results / Procedures / Treatments   Labs (all labs ordered are listed, but only abnormal results are displayed) Labs Reviewed  BASIC METABOLIC PANEL - Abnormal; Notable for the following components:      Result Value   Calcium 8.6 (*)    All other components within normal limits  CBC WITH DIFFERENTIAL/PLATELET - Abnormal; Notable for the following components:   WBC 13.8 (*)    Neutro Abs 11.6 (*)    Abs Immature Granulocytes 0.08 (*)    All other components within normal limits  RESP PANEL BY RT-PCR (FLU A&B, COVID) [id number]    EKG None  Radiology CT Head Wo Contrast  Result Date: 02/05/2021 CLINICAL DATA:  Blunt poly trauma. Status post fall at home. [place] [address] and hit her head. EXAM: CT HEAD WITHOUT CONTRAST CT CERVICAL SPINE WITHOUT CONTRAST TECHNIQUE: Multidetector CT imaging of the head and cervical  spine was performed following the standard protocol without intravenous contrast. Multiplanar CT image reconstructions of the cervical spine were also generated. RADIATION DOSE REDUCTION: This exam was performed according to the departmental dose-optimization program which includes automated exposure control, adjustment of the mA and/or kV according to patient size and/or use of iterative reconstruction technique. COMPARISON:  None. FINDINGS: Brain: No evidence of acute infarction, hemorrhage, extra-axial collection, ventriculomegaly, or mass effect. Generalized cerebral atrophy. Periventricular white matter low attenuation likely secondary to microangiopathy. Vascular: No significant cerebrovascular atherosclerotic calcifications are noted. No hyperdense vessels. Skull: Negative for fracture or focal lesion. Sinuses/Orbits: Visualized portions of the orbits are unremarkable. Visualized portions of the paranasal sinuses are unremarkable. Visualized portions of the mastoid air cells are unremarkable. Other: None. CT CERVICAL SPINE FINDINGS Alignment: Minimal grade 1 anterolisthesis of C4 on C5, C6 on C7, and C7 on T1 secondary to facet disease. Skull base and vertebrae: No acute fracture. No primary bone lesion or focal pathologic process. Soft tissues and spinal canal: No prevertebral fluid or swelling. No visible canal hematoma. Disc levels: Degenerative disease with disc height loss C2-3, C3-4, C4-5, C5-6, T2-3, and T3-4. calcification of the disc at C2-3. Ankylosis across the disc space at T2-3. Ankylosis of the C2-3 posterior elements bilaterally. Severe left facet arthropathy and mild right facet arthropathy throughout the cervical spine. Bilateral foraminal stenosis at C3-4, C4-5, C5-6 and C6-7. Upper chest: Lung apices are clear. Other: No fluid collection or hematoma. IMPRESSION: 1. No acute intracranial pathology. 2. No acute fracture or subluxation of the cervical spine. 3. Diffuse cervical spine  spondylosis as described above. Electronically Signed   By: Kathreen Devoid M.D.   On: 02/05/2021 10:58   CT Cervical Spine Wo Contrast  Result Date: 02/05/2021 CLINICAL DATA:  Blunt poly trauma. Status post fall at home. [place] [address] and hit her head. EXAM: CT HEAD WITHOUT CONTRAST CT CERVICAL SPINE WITHOUT CONTRAST TECHNIQUE: Multidetector CT imaging of the head and cervical spine was performed following the standard protocol without intravenous contrast. Multiplanar CT image reconstructions of the cervical spine were also generated. RADIATION DOSE REDUCTION: This exam was performed according to the departmental dose-optimization program which includes automated exposure control, adjustment of the mA and/or kV according to patient size and/or use of iterative reconstruction technique. COMPARISON:  None. FINDINGS: Brain: No evidence of acute infarction, hemorrhage, extra-axial collection, ventriculomegaly, or mass effect. Generalized cerebral atrophy. Periventricular white matter low attenuation likely secondary to microangiopathy. Vascular: No significant cerebrovascular atherosclerotic  calcifications are noted. No hyperdense vessels. Skull: Negative for fracture or focal lesion. Sinuses/Orbits: Visualized portions of the orbits are unremarkable. Visualized portions of the paranasal sinuses are unremarkable. Visualized portions of the mastoid air cells are unremarkable. Other: None. CT CERVICAL SPINE FINDINGS Alignment: Minimal grade 1 anterolisthesis of C4 on C5, C6 on C7, and C7 on T1 secondary to facet disease. Skull base and vertebrae: No acute fracture. No primary bone lesion or focal pathologic process. Soft tissues and spinal canal: No prevertebral fluid or swelling. No visible canal hematoma. Disc levels: Degenerative disease with disc height loss C2-3, C3-4, C4-5, C5-6, T2-3, and T3-4. calcification of the disc at C2-3. Ankylosis across the disc space at T2-3. Ankylosis of the C2-3 posterior elements bilaterally.  Severe left facet arthropathy and mild right facet arthropathy throughout the cervical spine. Bilateral foraminal stenosis at C3-4, C4-5, C5-6 and C6-7. Upper chest: Lung apices are clear. Other: No fluid collection or hematoma. IMPRESSION: 1. No acute intracranial pathology. 2. No acute fracture or subluxation of the cervical spine. 3. Diffuse cervical spine spondylosis as described above. Electronically Signed   By: Kathreen Devoid M.D.   On: 02/05/2021 10:58   CT PELVIS WO CONTRAST  Result Date: 02/05/2021 CLINICAL DATA:  Evaluate sclerotic lesion. Status post right hip fracture EXAM: CT PELVIS WITHOUT CONTRAST TECHNIQUE: Multidetector CT imaging of the pelvis was performed following the standard protocol without intravenous contrast. RADIATION DOSE REDUCTION: This exam was performed according to the departmental dose-optimization program which includes automated exposure control, adjustment of the mA and/or kV according to patient size and/or use of iterative reconstruction technique. COMPARISON:  04/06/2012 FINDINGS: Urinary Tract:  No abnormality visualized. Bowel: Sigmoid diverticulosis. No bowel wall thickening, inflammation, or distension. Vascular/Lymphatic: Extensive calcified atherosclerotic disease is noted involving the iliac vessels. No aneurysm identified. No enlarged lymph nodes. Reproductive: The uterus appears surgically absent. No adnexal mass identified Other:  No free fluid or fluid collections identified. Musculoskeletal: Status post left total hip arthroplasty. Hardware components are in anatomic alignment. No signs of periprosthetic fracture or dislocation. Impacted subcapital right femoral neck fracture is identified. No signs of dislocation. Well-defined sclerotic lesion within the right iliac bone has a narrow zone of transition measuring 1.6 cm in maximum dimension, image 41/6. As noted on study from earlier today this has increased in size compared with plain film radiograph dated  04/06/2012. On 04/06/2012 there is a 5 mm sclerotic lesion in this area. No additional focal bone lesions identified. IMPRESSION: 1. Impacted subcapital right femoral neck fracture. 2. Well-defined sclerotic lesion within the right iliac bone has increased in size compared with plain film radiograph dated 04/06/2012. This is of doubtful acute clinical significance. In the absence of a known malignancy this likely represents a benign bone island. A nonemergent, outpatient nuclear medicine whole-body bone scan may be considered to assess for uptake in this area as well as assess for additional lesions. 3. Status post left total hip arthroplasty without signs of periprosthetic fracture or dislocation. 4. Sigmoid diverticulosis without evidence for acute diverticulitis. 5. Aortic Atherosclerosis (ICD10-I70.0). Electronically Signed   By: Kerby Moors M.D.   On: 02/05/2021 13:38   DG Hip Unilat With Pelvis 2-3 Views Right  Result Date: 02/05/2021 CLINICAL DATA:  Trauma, fall EXAM: DG HIP (WITH OR WITHOUT PELVIS) 2-3V RIGHT COMPARISON:  03/29/2012 FINDINGS: There is disruption of trabecula in the subcapital portion of neck of proximal right femur. There is cortical irregularity in the lateral aspect of subcapital portion of proximal  right femur. There is previous left hip arthroplasty. There is 2.1 cm sclerotic density in the right iliac bone which has increased in size. IMPRESSION: Impacted subcapital fracture of neck of right femur. Previous left hip arthroplasty. There is 2.1 cm sclerotic density in the right iliac bone which appears larger in size in comparison with the examination of 03/29/2012. This may suggest a benign bone island or sclerotic metastatic disease. Electronically Signed   By: Elmer Picker M.D.   On: 02/05/2021 10:50    Procedures Procedures    Medications Ordered in ED Medications - No data to display  ED Course/ Medical Decision Making/ A&P On repeat exam the patient is aware  of finding of right subcapital femoral neck fracture.  I discussed her case with our orthopedic colleagues, her orthopedic team for operative repair, and with our internal medicine colleagues for admission.  Plan is for surgery later today.                         Medical Decision Making Adult female, generally well presents after mechanical fall found to have right hip fracture.  Patient is not on anticoagulant, is awake and alert, is distally neurovascular intact, required admission for surgical repair today.  Amount and/or Complexity of Data Reviewed Independent Historian: caregiver    Details: Daughter at bedside External Data Reviewed: notes.    Details: Prior op repair left hip, previously on Xarelto Labs: ordered. Decision-making details documented in ED Course. Radiology: ordered and independent interpretation performed. Decision-making details documented in ED Course.  Risk Decision regarding hospitalization. Emergency major surgery.  Critical Care Total time providing critical care: < 30 minutes Final Clinical Impression(s) / ED Diagnoses Final diagnoses:  Fall, initial encounter  Closed fracture of right hip, initial encounter [facility])     Carmin Muskrat, MD 02/05/21 1432

## 2021-02-05 NOTE — Consult Note (Signed)
Patient ID: RITHIKA SEEL MRN: [id number] DOB/AGE: September 30, 1927 88 y.o.  Admit date: 02/05/2021  Admission Diagnoses:  Principal Problem:   Closed right hip fracture [facility])   HPI: The patient is a 88 yr F currently admitted to [facility] ED for right hip fracture (impacted subcapital femoral neck fracture) - DOI 02/05/21. She last ate around 7-7:30 AM per patient and her daughter who accompanies at bedside. The patient is an independent community ambulator without any assistive device. She does her own yard mowing, rock garden building and is generally highly active for her age. She is widowed but lives in [place] with her daughter and son-in-law. The patient has PMH notable for HTN, TIA (decades prior), s/p thyroidectomy. Following her injury this morning, she drove up to [facility] because she has previously had left total hip arthroplasty with Dr. Wynelle Link in April 2014. She is doing excellent on that left side. This morning, she fell from standing after missing a step into her garage. She struck her right elbow, right hip and head without reported loss of consciousness. ED workup including imaging was notable for right hip fracture. She is not on blood thinners and denies smoking. She is generally against taking medications and has only needed occasional lisinopril that she takes PRN.   Past Medical History: Past Medical History:  Diagnosis Date   Arthritis    Cancer [facility])    "female; not sure if uterus or endometrosis"   Cough    occasional   Glaucoma, right eye    lens implant done for 4 to  yrs ago   High cholesterol    03/23/11 'don't take anything for it"   Hypertension    Left bundle branch block    Stroke ([place]) 03/23/11   "mini strokes; years ago; didn't go to dr;  Rene Paci had babies since then"   Thyroid disease    S/P thyroidectomy   Tinnitus of both ears     Surgical History: Past Surgical History:  Procedure Laterality Date   [place]  ~  [place]  ~ [place]     cadaver bone  2011   right small finger; "took more than cyst out too"   CATARACT EXTRACTION W/ INTRAOCULAR LENS IMPLANT  ~ 2010   right   ENDOVENOUS ABLATION SAPHENOUS VEIN W/ LASER  04/14/2010   left; GSV   EYE SURGERY     INTRACAPSULAR CATARACT EXTRACTION  ~ 2010   left   Stab Phlebectomies   06/03/10   R leg; "for varicose veins"   THYROIDECTOMY  ~ 1970's   TONSILLECTOMY  1943   TOTAL HIP ARTHROPLASTY Left 04/06/2012   Procedure: TOTAL HIP ARTHROPLASTY;  Surgeon: Gearlean Alf, MD;  Location: [facility] ORS;  Service: Orthopedics;  Laterality: Left;   TUBAL LIGATION  yrs ago    Family History: History reviewed. No pertinent family history.  Social History: Social History   Socioeconomic History   Marital status: Widowed    Spouse name: Not on file   Number of children: Not on file   Years of education: Not on file   Highest education level: Not on file  Occupational History   Not on file  Tobacco Use   Smoking status: Former    Types: Cigarettes    Quit date: 01/05/1970    Years since quitting: 51.1   Smokeless tobacco: Never   Tobacco comments:    03/23/11 'never  really had habit of smoking; I have smoked a few cigarettes"  Substance and Sexual Activity   Alcohol use: No   Drug use: No   Sexual activity: Not on file  Other Topics Concern   Not on file  Social History Narrative   Not on file   Social Determinants of Health   Financial Resource Strain: Not on file  Food Insecurity: Not on file  Transportation Needs: Not on file  Physical Activity: Not on file  Stress: Not on file  Social Connections: Not on file  Intimate Partner Violence: Not on file    Allergies: Penicillins  Medications: I have reviewed the patient's current medications.  Vital Signs: Patient Vitals for the past 24 hrs:  BP Temp Temp src Pulse Resp SpO2  02/05/21 1510 -- 98 F (36.7 C) Oral -- -- --  02/05/21 1400 (!)  151/94 -- -- 85 17 95 %  02/05/21 1300 (!) 165/91 -- -- 80 17 95 %  02/05/21 1200 (!) 163/91 -- -- 77 19 99 %  02/05/21 1104 (!) 163/78 -- -- 77 18 99 %  02/05/21 0936 (!) 181/92 97.9 F (36.6 C) Oral 94 18 96 %    Radiology: CT Head Wo Contrast  Result Date: 02/05/2021 CLINICAL DATA:  Blunt poly trauma. Status post fall at home. [place] and hit her head. EXAM: CT HEAD WITHOUT CONTRAST CT CERVICAL SPINE WITHOUT CONTRAST TECHNIQUE: Multidetector CT imaging of the head and cervical spine was performed following the standard protocol without intravenous contrast. Multiplanar CT image reconstructions of the cervical spine were also generated. RADIATION DOSE REDUCTION: This exam was performed according to the departmental dose-optimization program which includes automated exposure control, adjustment of the mA and/or kV according to patient size and/or use of iterative reconstruction technique. COMPARISON:  None. FINDINGS: Brain: No evidence of acute infarction, hemorrhage, extra-axial collection, ventriculomegaly, or mass effect. Generalized cerebral atrophy. Periventricular white matter low attenuation likely secondary to microangiopathy. Vascular: No significant cerebrovascular atherosclerotic calcifications are noted. No hyperdense vessels. Skull: Negative for fracture or focal lesion. Sinuses/Orbits: Visualized portions of the orbits are unremarkable. Visualized portions of the paranasal sinuses are unremarkable. Visualized portions of the mastoid air cells are unremarkable. Other: None. CT CERVICAL SPINE FINDINGS Alignment: Minimal grade 1 anterolisthesis of C4 on C5, C6 on C7, and C7 on T1 secondary to facet disease. Skull base and vertebrae: No acute fracture. No primary bone lesion or focal pathologic process. Soft tissues and spinal canal: No prevertebral fluid or swelling. No visible canal hematoma. Disc levels: Degenerative disease with disc height loss C2-3, C3-4, C4-5, C5-6, T2-3, and T3-4.  calcification of the disc at C2-3. Ankylosis across the disc space at T2-3. Ankylosis of the C2-3 posterior elements bilaterally. Severe left facet arthropathy and mild right facet arthropathy throughout the cervical spine. Bilateral foraminal stenosis at C3-4, C4-5, C5-6 and C6-7. Upper chest: Lung apices are clear. Other: No fluid collection or hematoma. IMPRESSION: 1. No acute intracranial pathology. 2. No acute fracture or subluxation of the cervical spine. 3. Diffuse cervical spine spondylosis as described above. Electronically Signed   By: Kathreen Devoid M.D.   On: 02/05/2021 10:58   CT Cervical Spine Wo Contrast  Result Date: 02/05/2021 CLINICAL DATA:  Blunt poly trauma. Status post fall at home. [place] and hit her head. EXAM: CT HEAD WITHOUT CONTRAST CT CERVICAL SPINE WITHOUT CONTRAST TECHNIQUE: Multidetector CT imaging of the head and cervical spine was performed following the standard protocol without intravenous contrast. Multiplanar CT image  reconstructions of the cervical spine were also generated. RADIATION DOSE REDUCTION: This exam was performed according to the departmental dose-optimization program which includes automated exposure control, adjustment of the mA and/or kV according to patient size and/or use of iterative reconstruction technique. COMPARISON:  None. FINDINGS: Brain: No evidence of acute infarction, hemorrhage, extra-axial collection, ventriculomegaly, or mass effect. Generalized cerebral atrophy. Periventricular white matter low attenuation likely secondary to microangiopathy. Vascular: No significant cerebrovascular atherosclerotic calcifications are noted. No hyperdense vessels. Skull: Negative for fracture or focal lesion. Sinuses/Orbits: Visualized portions of the orbits are unremarkable. Visualized portions of the paranasal sinuses are unremarkable. Visualized portions of the mastoid air cells are unremarkable. Other: None. CT CERVICAL SPINE FINDINGS Alignment: Minimal grade 1  anterolisthesis of C4 on C5, C6 on C7, and C7 on T1 secondary to facet disease. Skull base and vertebrae: No acute fracture. No primary bone lesion or focal pathologic process. Soft tissues and spinal canal: No prevertebral fluid or swelling. No visible canal hematoma. Disc levels: Degenerative disease with disc height loss C2-3, C3-4, C4-5, C5-6, T2-3, and T3-4. calcification of the disc at C2-3. Ankylosis across the disc space at T2-3. Ankylosis of the C2-3 posterior elements bilaterally. Severe left facet arthropathy and mild right facet arthropathy throughout the cervical spine. Bilateral foraminal stenosis at C3-4, C4-5, C5-6 and C6-7. Upper chest: Lung apices are clear. Other: No fluid collection or hematoma. IMPRESSION: 1. No acute intracranial pathology. 2. No acute fracture or subluxation of the cervical spine. 3. Diffuse cervical spine spondylosis as described above. Electronically Signed   By: Kathreen Devoid M.D.   On: 02/05/2021 10:58   CT PELVIS WO CONTRAST  Result Date: 02/05/2021 CLINICAL DATA:  Evaluate sclerotic lesion. Status post right hip fracture EXAM: CT PELVIS WITHOUT CONTRAST TECHNIQUE: Multidetector CT imaging of the pelvis was performed following the standard protocol without intravenous contrast. RADIATION DOSE REDUCTION: This exam was performed according to the departmental dose-optimization program which includes automated exposure control, adjustment of the mA and/or kV according to patient size and/or use of iterative reconstruction technique. COMPARISON:  04/06/2012 FINDINGS: Urinary Tract:  No abnormality visualized. Bowel: Sigmoid diverticulosis. No bowel wall thickening, inflammation, or distension. Vascular/Lymphatic: Extensive calcified atherosclerotic disease is noted involving the iliac vessels. No aneurysm identified. No enlarged lymph nodes. Reproductive: The uterus appears surgically absent. No adnexal mass identified Other:  No free fluid or fluid collections identified.  Musculoskeletal: Status post left total hip arthroplasty. Hardware components are in anatomic alignment. No signs of periprosthetic fracture or dislocation. Impacted subcapital right femoral neck fracture is identified. No signs of dislocation. Well-defined sclerotic lesion within the right iliac bone has a narrow zone of transition measuring 1.6 cm in maximum dimension, image 41/6. As noted on study from earlier today this has increased in size compared with plain film radiograph dated 04/06/2012. On 04/06/2012 there is a 5 mm sclerotic lesion in this area. No additional focal bone lesions identified. IMPRESSION: 1. Impacted subcapital right femoral neck fracture. 2. Well-defined sclerotic lesion within the right iliac bone has increased in size compared with plain film radiograph dated 04/06/2012. This is of doubtful acute clinical significance. In the absence of a known malignancy this likely represents a benign bone island. A nonemergent, outpatient nuclear medicine whole-body bone scan may be considered to assess for uptake in this area as well as assess for additional lesions. 3. Status post left total hip arthroplasty without signs of periprosthetic fracture or dislocation. 4. Sigmoid diverticulosis without evidence for acute diverticulitis. 5. Aortic  Atherosclerosis (ICD10-I70.0). Electronically Signed   By: Kerby Moors M.D.   On: 02/05/2021 13:38   DG Hip Unilat With Pelvis 2-3 Views Right  Result Date: 02/05/2021 CLINICAL DATA:  Trauma, fall EXAM: DG HIP (WITH OR WITHOUT PELVIS) 2-3V RIGHT COMPARISON:  03/29/2012 FINDINGS: There is disruption of trabecula in the subcapital portion of neck of proximal right femur. There is cortical irregularity in the lateral aspect of subcapital portion of proximal right femur. There is previous left hip arthroplasty. There is 2.1 cm sclerotic density in the right iliac bone which has increased in size. IMPRESSION: Impacted subcapital fracture of neck of right femur.  Previous left hip arthroplasty. There is 2.1 cm sclerotic density in the right iliac bone which appears larger in size in comparison with the examination of 03/29/2012. This may suggest a benign bone island or sclerotic metastatic disease. Electronically Signed   By: Elmer Picker M.D.   On: 02/05/2021 10:50    Labs: Recent Labs    02/05/21 1135  WBC 13.8*  RBC 4.32  HCT 38.2  PLT 215   Recent Labs    02/05/21 1135  NA 136  K 4.3  CL 102  CO2 26  BUN 13  CREATININE 0.70  GLUCOSE 95  CALCIUM 8.6*   No results for input(s): LABPT, INR in the last 72 hours.  Review of Systems: ROS as detailed in HPI  Physical Exam: There is no height or weight on file to calculate BMI.  Gen: AAOx3, NAD Breathing room air comfortably  RLE: Right leg held in external rotation TTP with gentle hip motion (avoiding excessive manipulation to prevent fracture displacement) SILT throughout Knee extension, ankle PF/DF/EHL 5/5 DP, PT 2+ to palpation CR<2s   Assessment and Plan: 88 year old lady with acute right hip fracture (impacted subcapital femoral neck fracture) - DOI 02/05/21  -reviewed history and imaging with patient as well as ED -CT obtained today in the ED confirms non-displaced impacted fracture pattern amenable to percutaneous fixation -plan today to undergo right hip closed reduction and percutaneous screw fixation with the possibility of partial vs total hip arthroplasty either today or at later date if the fracture appears displaced in the operating room. She and her daughter understand the latter is a possibility based on intraoperative fluoroscopy -we discussed the risks associated with hip fractures as well as percutaneous screw fixation, including the need for revision to hip arthroplasty at a later date if indicated -she has been NPO since 7-7:30 AM at which time she had a small breakfast -holding all chemical anticoagulation at this time -she is being admitted to the  [facility] hospitalist team postop -patient will undergo PT and OT evaluations postop  Armond Hang, MD Orthopaedic Surgeon [facility] [phone]  The risks and benefits were presented and reviewed. The risks due to hardware failure/irritation, new/persistent infection, stiffness, nerve/vessel/tendon injury, nonunion/malunion, wound healing issues, development of arthritis, failure of this surgery, possibility of delayed definitive surgery, need for further surgery, thromboembolic events, anesthesia/medical complications, amputation, death among others were discussed. The patient acknowledged the explanation, agreed to proceed with the plan and a consent was signed.

## 2021-02-05 NOTE — ED Triage Notes (Addendum)
Pt arrived via POV, mechanical fall at home. Fell and hit head, right side of body. C/o right hip pain, right arm pain. Denies any LOC or head pain. Not on any blood thinners.

## 2021-02-05 NOTE — Transfer of Care (Signed)
Immediate Anesthesia Transfer of Care Note  Patient: Alyssa [name]  Procedure(s) Performed: CANNULATED HIP PINNING (Right: Hip)  Patient Location: PACU  Anesthesia Type:General  Level of Consciousness: awake, oriented and patient cooperative  Airway & Oxygen Therapy: Patient Spontanous Breathing and Patient connected to face mask oxygen  Post-op Assessment: Report given to RN and Post -op Vital signs reviewed and stable  Post vital signs: Reviewed and stable  Last Vitals:  Vitals Value Taken Time  BP 200/104 02/05/21 1952  Temp    Pulse 93 02/05/21 1954  Resp 21 02/05/21 1954  SpO2 98 % 02/05/21 1954  Vitals shown include unvalidated device data.  Last Pain:  Vitals:   02/05/21 1510  TempSrc: Oral         Complications: No notable events documented.

## 2021-02-05 NOTE — Anesthesia Procedure Notes (Signed)
Procedure Name: Intubation Date/Time: 02/05/2021 5:35 PM Performed by: Niel Hummer, CRNA Pre-anesthesia Checklist: Patient identified, Emergency Drugs available, Suction available and Patient being monitored Patient Re-evaluated:Patient Re-evaluated prior to induction Oxygen Delivery Method: Circle system utilized Preoxygenation: Pre-oxygenation with 100% oxygen Induction Type: IV induction Ventilation: Mask ventilation without difficulty Laryngoscope Size: Glidescope and 3 Grade View: Grade I Tube type: Oral Tube size: 7.0 mm Number of attempts: 2 Airway Equipment and Method: Stylet and Video-laryngoscopy Placement Confirmation: ETT inserted through vocal cords under direct vision, positive ETCO2 and breath sounds checked- equal and bilateral Secured at: 21 cm Tube secured with: Tape Dental Injury: Teeth and Oropharynx as per pre-operative assessment  Comments: DL by CRNA MAC 4, grade 2b-3 view. Unable to place ETT. DL by MDA MAC 4, same view. Mask ventilation. Glidescope S3 by Dr. Gloris Manchester, grade 1 view. Passed tube successfully.

## 2021-02-05 NOTE — Op Note (Addendum)
02/05/2021  10:27 PM   PATIENT: Alyssa [name]  88 y.o. female  MRN: [id number]   PRE-OPERATIVE DIAGNOSIS:   Right impacted subcapital femoral neck fracture   POST-OPERATIVE DIAGNOSIS:   Right impacted subcapital femoral neck fracture   PROCEDURE: Right femoral neck fracture closed reduction and percutaneous screw fixation   SURGEON:  Armond Hang, MD   ASSISTANT: None   ANESTHESIA: General, regional   EBL: 10 cc   TOURNIQUET:   None   COMPLICATIONS: None apparent   DISPOSITION: Extubated, awake and stable to recovery.   INDICATION FOR PROCEDURE: The patient is a 69 yr F currently admitted to [facility] ED for right hip fracture (impacted subcapital femoral neck fracture) - DOI 02/05/21. She last ate around 7-7:30 AM per patient and her daughter who accompanies at bedside. The patient is an independent community ambulator without any assistive device. She does her own yard mowing, rock garden building and is generally highly active for her age. She is widowed but lives in [place] with her daughter and son-in-law. The patient has PMH notable for HTN, TIA (decades prior), s/p thyroidectomy. Following her injury this morning, she drove up to [facility] because she has previously had left total hip arthroplasty with Dr. Wynelle Link in April 2014. She is doing excellent on that left side. This morning, she fell from standing after missing a step into her garage. She struck her right elbow, right hip and head without reported loss of consciousness. ED workup including imaging was notable for right hip fracture. She is not on blood thinners and denies smoking. She is generally against taking medications and has only needed occasional lisinopril that she takes PRN.   We discussed the diagnosis, alternative treatment options, risks and benefits of the above surgical intervention, as well as alternative non-operative treatments. All questions/concerns were addressed and the  patient/family demonstrated appropriate understanding of the diagnosis, the procedure, the postoperative course, and overall prognosis. The patient wished to proceed with surgical intervention and signed an informed surgical consent as such, in each others presence prior to surgery.   PROCEDURE IN DETAIL: After preoperative consent was obtained and the correct operative site was identified, the patient was brought to the operating room supine on stretcher and transferred onto operating table (Hana). General anesthesia was induced. Preoperative antibiotics were administered. Surgical timeout was taken. The patient was then positioned supine and secured to the Hana table in appropriate position with adequate padding throughout. We began by taking intraoperative fluoroscopic images which confirmed stable impacted non-displaced alignment of the femoral neck fracture. The operative lower extremity was prepped and draped in standard sterile fashion.  We marked out the neck shaft angle and the level of the lesser trochanter using fluoroscopy. We then percutaneously placed a large guidepin for the future inferiormost screw. Following this, we then similarly placed two more large guidepins for the future superior anterior and posterior screws. In this way, the fracture was provisionally held in place. All pins were verified using fluoroscopy in multiple planes to ensure these were within bone throughout. We made percutaneous incisions and used the measuring device to estimate lengths for all screws.   We then overdrilled the inferiormost guidepin with a cannulated drill. We placed a fully threaded cannulated screw with washer and this was noted to have excellent purchase.   We then overdrilled the superior anterior guidepin with a cannulated drill. We placed a partially threaded screw without washer and this was noted to have excellent purchase.   Finally, we overdrilled  the superior posterior guidepin with a  cannulated drill. We placed a fully threaded screw and this was noted to have excellent purchase.   We used intraoperative fluroscopy to verify appropriate positions of all implants as well as appropriate alignment of the impacted femoral neck fracture.  The surgical sites were thoroughly irrigated. Hemostasis was achieved. The deeper layers were closed using 2-0 vicryl. The skin was closed without tension using 3-0 nylon suture and staples.   The thigh was cleaned with saline and sterile Aquacel dressing was applied. The patient was awakened from anesthesia and transported to the recovery room in stable condition.    FOLLOW UP PLAN: -return to [facility] under primary Medicine team -IV abx x 24 hrs (Vanc & Cipro) -WBAT RLE with walker and at least 1 person assist -pain meds per primary team -DVT Ppx: Lovenox 40 mg starting POD1 at 0900h (or whichever anticoagulation is preferred by primary team) -physical therapy and occupational therapy consults -discharge home per primary team -follow up as outpatient for wound check in 1 week with eventual plan to remove staples/sutures in 3 weeks   RADIOGRAPHS: AP, lateral, internal rotation radiographs of the right hip were obtained intraoperatively. These showed interval reduction and fixation of the fracture. All hardware is appropriately positioned and of the appropriate lengths. No other acute injuries are noted.   Armond Hang Orthopaedic Surgery [facility]

## 2021-02-05 NOTE — Anesthesia Preprocedure Evaluation (Addendum)
Anesthesia Evaluation  Patient identified by MRN, date of birth, ID band Patient awake    Reviewed: Allergy & Precautions, NPO status , Patient's Chart, lab work & pertinent test results  Airway Mallampati: II  TM Distance: >3 FB Neck ROM: Full    Dental no notable dental hx.    Pulmonary neg pulmonary ROS, former smoker,    Pulmonary exam normal        Cardiovascular hypertension, Pt. on medications  Rhythm:Regular Rate:Normal     Neuro/Psych CVA negative psych ROS   GI/Hepatic negative GI ROS, Neg liver ROS,   Endo/Other  negative endocrine ROS  Renal/GU negative Renal ROS  negative genitourinary   Musculoskeletal  (+) Arthritis , Osteoarthritis,  Right femoral neck fx   Abdominal   Peds  Hematology  (+) anemia ,   Anesthesia Other Findings   Reproductive/Obstetrics                           Anesthesia Physical Anesthesia Plan  ASA: 3  Anesthesia Plan: General   Post-op Pain Management:    Induction: Intravenous  PONV Risk Score and Plan: 3 and Ondansetron, Aprepitant and Dexamethasone  Airway Management Planned: Mask and Oral ETT  Additional Equipment: None  Intra-op Plan:   Post-operative Plan: Extubation in OR  Informed Consent: I have reviewed the patients History and Physical, chart, labs and discussed the procedure including the risks, benefits and alternatives for the proposed anesthesia with the patient or authorized representative who has indicated his/her understanding and acceptance.     Dental advisory given  Plan Discussed with: CRNA  Anesthesia Plan Comments: (Lab Results      Component                Value               Date                      WBC                      13.8 (H)            02/05/2021                HGB                      12.4                02/05/2021                HCT                      38.2                02/05/2021                 MCV                      88.4                02/05/2021                PLT                      215                 02/05/2021  Lab Results      Component                Value               Date                      NA                       136                 02/05/2021                K                        4.3                 02/05/2021                CO2                      26                  02/05/2021                GLUCOSE                  95                  02/05/2021                BUN                      13                  02/05/2021                CREATININE               0.70                02/05/2021                CALCIUM                  8.6 (L)             02/05/2021                GFRNONAA                 >60                 02/05/2021          )       Anesthesia Quick Evaluation

## 2021-02-06 ENCOUNTER — Other Ambulatory Visit: Payer: Self-pay

## 2021-02-06 LAB — CBC
HCT: 36.7 % (ref 36.0–46.0)
Hemoglobin: 11.9 g/dL — ABNORMAL LOW (ref 12.0–15.0)
MCH: 28.5 pg (ref 26.0–34.0)
MCHC: 32.4 g/dL (ref 30.0–36.0)
MCV: 88 fL (ref 80.0–100.0)
Platelets: 209 10*3/uL (ref 150–400)
RBC: 4.17 MIL/uL (ref 3.87–5.11)
RDW: 13.2 % (ref 11.5–15.5)
WBC: 10.8 10*3/uL — ABNORMAL HIGH (ref 4.0–10.5)
nRBC: 0 % (ref 0.0–0.2)

## 2021-02-06 LAB — CREATININE, SERUM
Creatinine, Ser: 0.69 mg/dL (ref 0.44–1.00)
GFR, Estimated: >60 mL/min (ref >60)

## 2021-02-06 LAB — BASIC METABOLIC PANEL
Anion gap: 9 (ref 5–15)
BUN: 12 mg/dL (ref 8–23)
CO2: 24 mmol/L (ref 22–32)
Calcium: 8.6 mg/dL — ABNORMAL LOW (ref 8.9–10.3)
Chloride: 102 mmol/L (ref 98–111)
Creatinine, Ser: 0.58 mg/dL (ref 0.44–1.00)
GFR, Estimated: >60 mL/min (ref >60)
Glucose, Bld: 151 mg/dL — ABNORMAL HIGH (ref 70–99)
Potassium: 3.8 mmol/L (ref 3.5–5.1)
Sodium: 135 mmol/L (ref 135–145)

## 2021-02-06 MED ORDER — SODIUM CHLORIDE 0.9 % IV SOLN: INTRAVENOUS | Status: DC | PRN

## 2021-02-06 MED ORDER — ADULT MULTIVITAMIN W/MINERALS CH
1.0000 | ORAL_TABLET | Freq: Every day | ORAL | Status: DC
  Administered 2021-02-06 – 2021-02-07 (×2): 1 via ORAL
  Filled 2021-02-06 (×2): qty 1

## 2021-02-06 MED ORDER — ENSURE ENLIVE PO LIQD
237.0000 mL | ORAL | Status: DC
  Administered 2021-02-06: 237 mL via ORAL

## 2021-02-06 NOTE — TOC Initial Note (Signed)
Transition of Care [facility]) - Initial/Assessment Note    Patient Details  Name: Alyssa [name] MRN: [id number] Date of Birth: January 18, 1927  Transition of Care [facility]) CM/SW Contact:    Lennart Pall, LCSW Phone Number: 02/06/2021, 1:46 PM  Clinical Narrative:                 Met with pt and daughter today to introduce self/ TOC role and review dc needs.  Pt very pleasant, humorous and engaged. Reports that she lives with her daughter in [place] and was extremely independent PTA.  She fully intends to return home at [employer] and daughter confirms plan. They are currently awaiting PT eval.  TOC will follow along to arrange any follow up recommended.  Pt reports she has all needed DME from prior ortho surgery.  Expected Discharge Plan: [place] Barriers to Discharge: Continued Medical Work up   Patient Goals and CMS Choice Patient states their goals for this hospitalization and ongoing recovery are:: return home      Expected Discharge Plan and Services Expected Discharge Plan: [place] In-house Referral: Clinical Social Work     Living arrangements for the past 2 months: Single Family Home                 DME Arranged: N/A DME Agency: NA                  Prior Living Arrangements/Services Living arrangements for the past 2 months: [place] Lives with:: Adult Children Patient language and need for interpreter reviewed:: Yes Do you feel safe going back to the place where you live?: Yes      Need for Family Participation in Patient Care: Yes (Comment) Care giver support system in place?: Yes (comment)   Criminal Activity/Legal Involvement Pertinent to Current Situation/Hospitalization: No - Comment as needed  Activities of Daily Living Home Assistive Devices/Equipment: Eyeglasses ADL Screening (condition at time of admission) Patient's cognitive ability adequate to safely complete daily activities?: Yes Is the patient deaf or have  difficulty hearing?: No Does the patient have difficulty seeing, even when wearing glasses/contacts?: No Does the patient have difficulty concentrating, remembering, or making decisions?: No Patient able to express need for assistance with ADLs?: Yes Does the patient have difficulty dressing or bathing?: No Independently performs ADLs?: Yes (appropriate for developmental age) Does the patient have difficulty walking or climbing stairs?: No Weakness of Legs: Right Weakness of Arms/Hands: None  Permission Sought/Granted Permission sought to share information with : Family Supports Permission granted to share information with : Yes, Verbal Permission Granted  Share Information with NAME: Rowe Robert     Permission granted to share info w Relationship: daughter  Permission granted to share info w Contact Information: [phone]  Emotional Assessment Appearance:: Appears stated age Attitude/Demeanor/Rapport: Self-Confident, Charismatic, Gracious Affect (typically observed): Happy Orientation: : Oriented to Self, Oriented to Place, Oriented to  Time, Oriented to Situation Alcohol / Substance Use: Not Applicable Psych Involvement: No (comment)  Admission diagnosis:  Closed right hip fracture ([place]) [S72.001A] Fall, initial encounter [W19.XXXA] Closed fracture of right hip, initial encounter [facility]) [S72.001A] Patient Active Problem List   Diagnosis Date Noted   Closed right hip fracture ([place]) 02/05/2021   Postoperative anemia due to acute blood loss 04/08/2012   OA (osteoarthritis) of hip 04/06/2012   Left shoulder pain 03/23/2011   Chest pain at rest 03/23/2011   Hypertension 03/23/2011   ARTHRITIS, LEFT HIP 06/06/2009  SPONDYLOSIS 06/06/2009   SPONDYLOLYSIS 06/06/2009   SPONDYLOLITHESIS 06/06/2009   PCP:  Ulyses Southward, MD Pharmacy:   [place], [place] [address] [place] [address] Phone: [phone] Fax:  [phone]     Social Determinants of Health (SDOH) Interventions    Readmission Risk Interventions Readmission Risk Prevention Plan 02/06/2021  Post Dischage Appt Complete  Medication Screening Complete  Transportation Screening Complete  Some recent data might be hidden

## 2021-02-06 NOTE — Progress Notes (Signed)
Initial Nutrition Assessment  DOCUMENTATION CODES:  Not applicable  INTERVENTION:  Continue current diet as ordered, encourage PO intake MVI with minerals daily Ensure Enlive po daily, each supplement provides 350 kcal and 20 grams of protein.  NUTRITION DIAGNOSIS:  Increased nutrient needs related to post-op healing, hip fracture as evidenced by estimated needs.  GOAL:  Patient will meet greater than or equal to 90% of their needs  MONITOR:  PO intake, Supplement acceptance  REASON FOR ASSESSMENT:  Consult Hip fracture protocol  ASSESSMENT:  88 y.o. female with hx of macular degeneration, HTN, and thyroid dx (S/P thyroidectomy) presented to ED after a fall at home. Imaging in ED showed a right hip fracture. Taken for surgical repair 2/1  2/1 - Op, Right femoral neck fracture closed reduction and percutaneous screw fixation  Unable to reach pt on room phone at this time, busy signal.   Reviewed chart and per PCP notes, weight appears stable at ~130 lbs for several years. 1 meal recorded this admission is 100% consumed. Per CM assessment, pt reports she is independent of ADLs PTA and lives with her daughter.   Average Meal Intake: 2/2: 100% intake x 1 recorded meals  Nutritionally Relevant Medications: Continuous Infusions:  ciprofloxacin 200 mg (02/06/21 0558)   Labs Reviewed  NUTRITION - FOCUSED PHYSICAL EXAM: Defer to in-person assessment  Diet Order:   Diet Order             Diet regular Room service appropriate? Yes; Fluid consistency: Thin  Diet effective now                   EDUCATION NEEDS:  No education needs have been identified at this time  Skin:  Skin Assessment: Reviewed RN Assessment (surgical incision right hip)  Last BM:  2/1  Height:  Ht Readings from Last 1 Encounters:  02/05/21 5\' 2"  (1.575 m)   Weight:  Wt Readings from Last 1 Encounters:  02/05/21 59 kg    Ideal Body Weight:  50 kg  BMI:  Body mass index is 23.78  kg/m.  Estimated Nutritional Needs:  Kcal:  1400-1600 kcal/d Protein:  70-80 g/d Fluid:  1.5-1.8 L/d   Ranell Patrick, RD, LDN Clinical Dietitian RD pager # available in AMION  After hours/weekend pager # available in [facility]

## 2021-02-06 NOTE — Progress Notes (Signed)
Physical Therapy Treatment Patient Details Name: Alyssa [name] MRN: [id number] DOB: 05/18/1927 Today's Date: 02/06/2021   History of Present Illness Pt admitted s/p fall with R femoral neck fx and now s/p closed reductioin and percutaneous screw fixation.  Pt with hx of L BB, CVA, Tinnitus and L THR (14)    PT Comments    PT returned to pt room shortly after eval with pt stating if she did well with PT, Dr said she could dc today.  HHPT not in place and no dc order noted on chart.  Pt up to ambulate in hall again and negotiated stairs.  Pt performed HEP with written instruction and progression provided for use until HHPT is in place.  Pt hopeful for dc today.  RN and Charge both aware of situation.  Will follow up with pt in am if still in hospital.  Recommendations for follow up therapy are one component of a multi-disciplinary discharge planning process, led by the attending physician.  Recommendations may be updated based on patient status, additional functional criteria and insurance authorization.  Follow Up Recommendations  Home health PT     Assistance Recommended at Discharge Intermittent Supervision/Assistance  Patient can return home with the following A little help with walking and/or transfers;A little help with bathing/dressing/bathroom;Assistance with cooking/housework;Assist for transportation;Help with stairs or ramp for entrance   Equipment Recommendations  None recommended by PT    Recommendations for Other Services       Precautions / Restrictions Precautions Precautions: Fall Restrictions Weight Bearing Restrictions: No Other Position/Activity Restrictions: WBAT     Mobility  Bed Mobility Overal bed mobility: Needs Assistance Bed Mobility: Supine to Sit     Supine to sit: Supervision     General bed mobility comments: Up in chair and requests back to same    Transfers Overall transfer level: Needs assistance Equipment used: Rolling walker (2  wheels) Transfers: Sit to/from Stand Sit to Stand: Min guard           General transfer comment: cues for LE management and use of UEs to self assist    Ambulation/Gait Ambulation/Gait assistance: Min guard, Supervision Gait Distance (Feet): 75 Feet Assistive device: Rolling walker (2 wheels) Gait Pattern/deviations: Step-to pattern, Step-through pattern, Decreased step length - right, Decreased step length - left, Shuffle, Trunk flexed       General Gait Details: cues for posture, position from RW and initial sequence   Stairs Stairs: Yes Stairs assistance: Min assist Stair Management: One rail Right, Step to pattern, Sideways, Forwards, With cane Number of Stairs: 4 General stair comments: 2 steps with cane and rail and 2 steps sideways.  Cues for sequence and foot/cane placement   Wheelchair Mobility    Modified Rankin (Stroke Patients Only)       Balance Overall balance assessment: Needs assistance Sitting-balance support: No upper extremity supported, Feet supported Sitting balance-Leahy Scale: Good     Standing balance support: No upper extremity supported Standing balance-Leahy Scale: Fair                              Cognition Arousal/Alertness: Awake/alert Behavior During Therapy: WFL for tasks assessed/performed Overall Cognitive Status: Within Functional Limits for tasks assessed  Exercises General Exercises - Lower Extremity Ankle Circles/Pumps: AROM, Both, 15 reps, Supine Quad Sets: AROM, Both, 10 reps, Supine Long Arc Quad: AROM, Right, 10 reps, Seated Heel Slides: AAROM, Right, 20 reps, Supine Hip ABduction/ADduction: AAROM, Right, 15 reps, Supine    General Comments        Pertinent Vitals/Pain Pain Assessment Pain Assessment: No/denies pain    Home Living Family/patient expects to be discharged to:: Private residence Living Arrangements: Children Available  Help at Discharge: Available 24 hours/day;Family Type of Home: House Home Access: Stairs to enter Entrance Stairs-Rails: Right Entrance Stairs-Number of Steps: 3   Home Layout: One level Home Equipment: Conservation officer, nature (2 wheels);Cane - single point;BSC/3in1;Wheelchair - manual      Prior Function            PT Goals (current goals can now be found in the care plan section) Acute Rehab PT Goals Patient Stated Goal: Regain IND PT Goal Formulation: With patient Time For Goal Achievement: 02/13/21 Potential to Achieve Goals: Good Progress towards PT goals: Progressing toward goals    Frequency    Min 5X/week      PT Plan Current plan remains appropriate    Co-evaluation              AM-PAC PT "6 Clicks" Mobility   Outcome Measure  Help needed turning from your back to your side while in a flat bed without using bedrails?: A Little Help needed moving from lying on your back to sitting on the side of a flat bed without using bedrails?: A Little Help needed moving to and from a bed to a chair (including a wheelchair)?: A Little Help needed standing up from a chair using your arms (e.g., wheelchair or bedside chair)?: A Little Help needed to walk in hospital room?: A Little Help needed climbing 3-5 steps with a railing? : A Little 6 Click Score: 18    End of Session Equipment Utilized During Treatment: Gait belt Activity Tolerance: Patient tolerated treatment well Patient left: in chair;with call bell/phone within reach;with chair alarm set;with family/visitor present Nurse Communication: Mobility status PT Visit Diagnosis: Unsteadiness on feet (R26.81);Difficulty in walking, not elsewhere classified (R26.2)     Time: 1540-1606 PT Time Calculation (min) (ACUTE ONLY): 26 min  Charges:  $Gait Training: 8-22 mins $Therapeutic Exercise: 8-22 mins                     [place] Pager [phone] Office  [phone]    [facility] 02/06/2021, 4:34 PM

## 2021-02-06 NOTE — Anesthesia Postprocedure Evaluation (Signed)
Anesthesia Post Note  Patient: Alyssa [name]  Procedure(s) Performed: CANNULATED HIP PINNING (Right: Hip)     Patient location during evaluation: PACU Anesthesia Type: General Level of consciousness: awake and alert Pain management: pain level controlled Vital Signs Assessment: post-procedure vital signs reviewed and stable Respiratory status: spontaneous breathing, nonlabored ventilation, respiratory function stable and patient connected to nasal cannula oxygen Cardiovascular status: blood pressure returned to baseline and stable Postop Assessment: no apparent nausea or vomiting Anesthetic complications: no   No notable events documented.  Last Vitals:  Vitals:   02/06/21 1005 02/06/21 1434  BP: 120/77 117/65  Pulse: 91 79  Resp: 18 16  Temp: 36.8 C 36.7 C  SpO2: 99% 98%    Last Pain:  Vitals:   02/06/21 1434  TempSrc: Oral  PainSc:                  March Rummage Ciaira [place]ividad

## 2021-02-06 NOTE — Plan of Care (Signed)
°  Problem: Clinical Measurements: Goal: Ability to maintain clinical measurements within normal limits will improve Outcome: Progressing   Problem: Activity: Goal: Risk for activity intolerance will decrease Outcome: Progressing   Problem: Safety: Goal: Ability to remain free from injury will improve Outcome: Progressing   Problem: Pain Managment: Goal: General experience of comfort will improve Outcome: Progressing   

## 2021-02-06 NOTE — Progress Notes (Signed)
[facility]  PROGRESS NOTE  Alyssa [name] ZDG:[id number] DOB: 1927-02-14 DOA: 02/05/2021 PCP: Ulyses Southward, MD   Brief HPI:   88 year old female with medical history of macular degeneration presented after a fall.  Patient reports she was descending steps into her garage when she missed stepped and fell.  She hit her right elbow and hip on the way down.  Also hit her head but no loss of consciousness.  Patient was brought to the ED, found to have right hip fracture.   Subjective   Patient seen and examined, s/p percutaneous screw fixation.   Assessment/Plan:    Right impacted subcapital femoral neck fracture -S/p closed reduction and percutaneous screw fixation -PT consulted, patient to go home with home health PT -Awaiting DVT prophylaxis recommendations per orthopedics   Hypertension -Blood pressure is stable -Patient takes lisinopril 5 mg as needed for BP greater than 160    Medications     enoxaparin (LOVENOX) injection  40 mg Subcutaneous Q24H   feeding supplement  237 mL Oral Q24H   multivitamin with minerals  1 tablet Oral Daily     Data Reviewed:   CBG:  No results for input(s): GLUCAP in the last 168 hours.  SpO2: 98 % O2 Flow Rate (L/min): 2 L/min    Vitals:   02/06/21 0213 02/06/21 0611 02/06/21 1005 02/06/21 1434  BP: (!) 144/66 133/69 120/77 117/65  Pulse: 90 81 91 79  Resp: 16 17 18 16   Temp: 98.7 F (37.1 C) 97.6 F (36.4 C) 98.2 F (36.8 C) 98.1 F (36.7 C)  TempSrc: Oral  Oral Oral  SpO2: 98% 99% 99% 98%  Weight:      Height:          Data Reviewed:  Basic Metabolic Panel: Recent Labs  Lab 02/05/21 1135 02/06/21 0321  NA 136 135  K 4.3 3.8  CL 102 102  CO2 26 24  GLUCOSE 95 151*  BUN 13 12  CREATININE 0.70 0.58   0.69  CALCIUM 8.6* 8.6*    CBC: Recent Labs  Lab 02/05/21 1135 02/06/21 0321  WBC 13.8* 10.8*  NEUTROABS 11.6*  --   HGB 12.4 11.9*  HCT 38.2 36.7  MCV 88.4 88.0  PLT 215 209        Antibiotics: Anti-infectives (From admission, onward)    Start     Dose/Rate Route Frequency Ordered Stop   02/06/21 0600  vancomycin (VANCOCIN) IVPB 1000 mg/200 mL premix        1,000 mg 200 mL/hr over 60 Minutes Intravenous On call to O.R. 02/05/21 1557 02/05/21 1729   02/06/21 0500  ciprofloxacin (CIPRO) IVPB 200 mg        200 mg 100 mL/hr over 60 Minutes Intravenous Every 12 hours 02/05/21 2112 02/07/21 0459   02/06/21 0200  vancomycin (VANCOREADY) IVPB 750 mg/150 mL        750 mg 150 mL/hr over 60 Minutes Intravenous  Once 02/05/21 2112 02/06/21 0324   02/05/21 1600  ciprofloxacin (CIPRO) IVPB 400 mg        400 mg 200 mL/hr over 60 Minutes Intravenous  Once 02/05/21 1557 02/05/21 1806        DVT prophylaxis: Lovenox  Code Status: Full code  Family Communication: No family at bedside      Objective    Physical Examination:   General-appears in no acute distress Heart-S1-S2, regular, no murmur auscultated Lungs-clear to auscultation bilaterally, no wheezing or crackles auscultated Abdomen-soft, nontender, no organomegaly  Extremities-no edema in the lower extremities Neuro-alert, oriented x3, no focal deficit noted   Status is: Inpatient right hip fracture        [place] [facility] If 7PM-7AM, please contact night-coverage at [email or web address], Office  [phone]   02/06/2021, 5:35 PM  LOS: 1 day

## 2021-02-06 NOTE — Evaluation (Signed)
Physical Therapy Evaluation Patient Details Name: Alyssa [name] MRN: [id number] DOB: [birth year] Today's Date: 02/06/2021  History of Present Illness  Pt admitted s/p fall with R femoral neck fx and now s/p closed reductioin and percutaneous screw fixation.  Pt with hx of L BB, CVA, Tinnitus and L THR (14)  Clinical Impression  Pt admitted as above and presenting with functional mobility limitations 2* decreased R LE strength/ROM and ambulatory balance deficits.  Pt should progress well to dc home with family assist.     Recommendations for follow up therapy are one component of a multi-disciplinary discharge planning process, led by the attending physician.  Recommendations may be updated based on patient status, additional functional criteria and insurance authorization.  Follow Up Recommendations Home health PT    Assistance Recommended at Discharge Intermittent Supervision/Assistance  Patient can return home with the following  A little help with walking and/or transfers;A little help with bathing/dressing/bathroom;Assistance with cooking/housework;Assist for transportation;Help with stairs or ramp for entrance    Equipment Recommendations None recommended by PT  Recommendations for Other Services       Functional Status Assessment Patient has had a recent decline in their functional status and demonstrates the ability to make significant improvements in function in a reasonable and predictable amount of time.     Precautions / Restrictions Precautions Precautions: Fall      Mobility  Bed Mobility Overal bed mobility: Needs Assistance Bed Mobility: Supine to Sit     Supine to sit: Supervision     General bed mobility comments: Increased time but no physical assist    Transfers Overall transfer level: Needs assistance Equipment used: Rolling walker (2 wheels) Transfers: Sit to/from Stand Sit to Stand: Min assist           General transfer comment: cues for LE  management and use of UEs to self assist    Ambulation/Gait Ambulation/Gait assistance: Min assist, Min guard Gait Distance (Feet): 130 Feet Assistive device: Rolling walker (2 wheels) Gait Pattern/deviations: Step-to pattern, Step-through pattern, Decreased step length - right, Decreased step length - left, Shuffle, Trunk flexed       General Gait Details: cues for posture, position from RW and initial sequence  Stairs            Wheelchair Mobility    Modified Rankin (Stroke Patients Only)       Balance Overall balance assessment: Needs assistance Sitting-balance support: No upper extremity supported, Feet supported Sitting balance-Leahy Scale: Good     Standing balance support: Bilateral upper extremity supported Standing balance-Leahy Scale: Poor                               Pertinent Vitals/Pain Pain Assessment Pain Assessment: No/denies pain    Home Living Family/patient expects to be discharged to:: Private residence Living Arrangements: Children Available Help at Discharge: Available 24 hours/day;Family Type of Home: House Home Access: Stairs to enter Entrance Stairs-Rails: Right Entrance Stairs-Number of Steps: 3   Home Layout: One level Home Equipment: Conservation officer, nature (2 wheels);Cane - single point;BSC/3in1;Wheelchair - manual      Prior Function Prior Level of Function : Independent/Modified Independent                     Hand Dominance        Extremity/Trunk Assessment   Upper Extremity Assessment Upper Extremity Assessment: Overall WFL for tasks assessed    Lower Extremity  Assessment Lower Extremity Assessment: RLE deficits/detail RLE Deficits / Details: 2+/5 strength at hip with AAROM at hip to 90 flex and 15 abd    Cervical / Trunk Assessment Cervical / Trunk Assessment: Normal  Communication   Communication: No difficulties  Cognition Arousal/Alertness: Awake/alert Behavior During Therapy: WFL for tasks  assessed/performed Overall Cognitive Status: Within Functional Limits for tasks assessed                                          General Comments      Exercises     Assessment/Plan    PT Assessment Patient needs continued PT services  PT Problem List Decreased strength;Decreased range of motion;Decreased activity tolerance;Decreased balance;Decreased mobility;Decreased knowledge of use of DME       PT Treatment Interventions DME instruction;Gait training;Stair training;Functional mobility training;Therapeutic activities;Therapeutic exercise;Balance training;Patient/family education    PT Goals (Current goals can be found in the Care Plan section)  Acute Rehab PT Goals Patient Stated Goal: Regain IND PT Goal Formulation: With patient Time For Goal Achievement: 02/13/21 Potential to Achieve Goals: Good    Frequency Min 5X/week     Co-evaluation               AM-PAC PT "6 Clicks" Mobility  Outcome Measure Help needed turning from your back to your side while in a flat bed without using bedrails?: A Little Help needed moving from lying on your back to sitting on the side of a flat bed without using bedrails?: A Little Help needed moving to and from a bed to a chair (including a wheelchair)?: A Little Help needed standing up from a chair using your arms (e.g., wheelchair or bedside chair)?: A Little Help needed to walk in hospital room?: A Little Help needed climbing 3-5 steps with a railing? : A Little 6 Click Score: 18    End of Session Equipment Utilized During Treatment: Gait belt Activity Tolerance: Patient tolerated treatment well Patient left: in chair;with call bell/phone within reach;with chair alarm set;with family/visitor present Nurse Communication: Mobility status PT Visit Diagnosis: Unsteadiness on feet (R26.81);Difficulty in walking, not elsewhere classified (R26.2)    Time: 6979-4801 PT Time Calculation (min) (ACUTE ONLY): 29  min   Charges:   PT Evaluation $PT Eval Low Complexity: 1 Low PT Treatments $Gait Training: 8-22 mins        Debe Coder PT Acute Rehabilitation Services Pager [phone] Office [phone]   Kerigan Narvaez 02/06/2021, 4:27 PM

## 2021-02-06 NOTE — Discharge Instructions (Signed)
Armond Hang, MD [facility]  Please read the following information regarding your care after surgery.  Weight Bearing ? Weightbear as tolerated on the operated leg using walker and at least 1 person assist.  Cast / Splint / Dressing ? Keep dressing clean dry and intact. We will change dressing at 1st postop appointment  Swelling It is normal for you to have swelling where you had surgery. To reduce swelling and pain, keep ice over the area for brief periods.   Follow Up Call my office at [phone] when you are discharged from the hospital or surgery center to schedule an appointment to be seen 1-2 weeks after surgery.  Call my office at [phone] if you develop a fever >101.5 F, nausea, vomiting, bleeding from the surgical site or severe pain.

## 2021-02-06 NOTE — Plan of Care (Signed)
Plan of care reviewed and discussed with the patient. 

## 2021-02-06 NOTE — Progress Notes (Signed)
Subjective: 1 Day Post-Op Procedure(s) (LRB): CANNULATED HIP PINNING (Right)  Patient reports minimal pain and feels much better after surgery. She has a good appetite and is in good spirits. Accompanied again by daughter at bedside.    Objective:   VITALS:  Temp:  [97.6 F (36.4 C)-98.7 F (37.1 C)] 98.1 F (36.7 C) (02/02 2205) Pulse Rate:  [77-91] 77 (02/02 2205) Resp:  [16-18] 16 (02/02 2205) BP: (117-144)/(65-77) 131/68 (02/02 2205) SpO2:  [95 %-99 %] 95 % (02/02 2205)  Gen: AAOx3, NAD  Right lower extremity: Aquacel dressing c/d/I Ankle PF/DF/EHL 5/5 Wiggles toes SILT over toes DP, PT 2+ CR<2s    LABS Recent Labs    02/05/21 1135 02/06/21 0321  HGB 12.4 11.9*  WBC 13.8* 10.8*  PLT 215 209   Recent Labs    02/05/21 1135 02/06/21 0321  NA 136 135  K 4.3 3.8  CL 102 102  CO2 26 24  BUN 13 12  CREATININE 0.70 0.58   0.69  GLUCOSE 95 151*   No results for input(s): LABPT, INR in the last 72 hours.   Assessment/Plan: 1 Day Post-Op Procedure(s) (LRB): CANNULATED HIP PINNING (Right)  -WBAT RLE with walker and at least 1 person assist -pain meds per primary team -DVT Ppx: Lovenox 40 mg or whichever anticoagulation and duration is preferred by primary team -physical therapy and occupational therapy consults -follow up as outpatient for wound check in 1 week with eventual plan to remove staples/sutures in 3 weeks  Armond Hang 02/06/2021, 11:25 PM

## 2021-02-07 ENCOUNTER — Encounter (HOSPITAL_COMMUNITY): Payer: Self-pay | Admitting: Orthopaedic Surgery

## 2021-02-07 DIAGNOSIS — I10 Essential (primary) hypertension: Secondary | ICD-10-CM

## 2021-02-07 MED ORDER — ENOXAPARIN (LOVENOX) PATIENT EDUCATION KIT
PACK | Freq: Once | Status: DC
  Filled 2021-02-07: qty 1

## 2021-02-07 MED ORDER — ENOXAPARIN SODIUM 40 MG/0.4ML IJ SOSY: 40.0000 mg | PREFILLED_SYRINGE | INTRAMUSCULAR | 0 refills | Status: AC

## 2021-02-07 MED ORDER — HYDROCODONE-ACETAMINOPHEN 5-325 MG PO TABS: 1.0000 | ORAL_TABLET | Freq: Four times a day (QID) | ORAL | 0 refills | Status: AC | PRN

## 2021-02-07 NOTE — Progress Notes (Signed)
OT Cancellation Note  Patient Details Name: Alyssa [name] MRN: [id number] DOB: [birth year]   Cancelled Treatment:    Reason Eval/Treat Not Completed: OT screened, no needs identified, will sign off. Reports no OT needs.   Jomel Whittlesey L Annalisia Ingber 02/07/2021, 8:30 AM

## 2021-02-07 NOTE — TOC Transition Note (Signed)
Transition of Care [facility]) - CM/SW Discharge Note   Patient Details  Name: Alyssa [name] MRN: [id number] Date of Birth: 28-Apr-1927  Transition of [facility] CM/SW Contact:  Lennart Pall, LCSW Phone Number: 02/07/2021, 10:39 AM   Clinical Narrative:     Able to secure HHPT with [facility] branch).  No DME needs.  Ready for dc today.  Final next level of care: [place] Barriers to Discharge: Barriers Resolved   Patient Goals and CMS Choice Patient states their goals for this hospitalization and ongoing recovery are:: return home      Discharge Placement                       Discharge Plan and Services In-house Referral: Clinical Social Work              DME Arranged: N/A DME Agency: NA       HH Arranged: PT [place] Agency: [place] [facility]) Date [place]: 02/07/21 Time HH Agency Contacted: 1000 Representative spoke with at [place]: [place] Interventions     Readmission Risk Interventions Readmission Risk Prevention Plan 02/06/2021  Post Dischage Appt Complete  Medication Screening Complete  Transportation Screening Complete  Some recent data might be hidden

## 2021-02-07 NOTE — Discharge Summary (Signed)
Physician Discharge Summary   Patient: Alyssa [name] MRN: [id number] DOB: [birth year]  Admit date:     02/05/2021  Discharge date: 02/07/21  Discharge Physician: Oswald Hillock   PCP: Ulyses Southward, MD   Recommendations at discharge:   Follow-up orthopedic surgery in 1 week  Discharge Diagnoses: Principal Problem:   Closed right hip fracture ([place])  Resolved Problems:   * No resolved hospital problems. *   Hospital Course: 88 year old female with medical history of macular degeneration presented after a fall.  Patient reports she was descending steps into her garage when she missed stepped and fell.  She hit her right elbow and hip on the way down.  Also hit her head but no loss of consciousness.  Patient was brought to the ED, found to have right hip fracture.  Assessment and Plan:  Right impacted subcapital femoral neck fracture -S/p closed reduction and percutaneous screw fixation -PT consulted, patient to go home with home health PT -Follow-up orthopedic surgery in 1 week    Hypertension -Blood pressure is stable -Patient takes lisinopril 5 mg as needed for BP greater than 160  DVT prophylaxis -Discussed with orthopedic surgeon Dr. Kathaleen Bury -He recommends to discharge on Lovenox 40 mg subcu daily for 3 weeks total        Consultants: Orthopedics Procedures performed: hip fracture repair Disposition: Home Diet recommendation:  Discharge Diet Orders (From admission, onward)     Start     Ordered   02/07/21 0000  Diet - low sodium heart healthy        02/07/21 1148           Regular diet  DISCHARGE MEDICATION: Allergies as of 02/07/2021       Reactions   Penicillins Swelling   "probably 1970; lips and tongue felt but didn't look swollen; dr told me not to take it again"   Tape Other (See Comments)   Tears skin        Medication List     TAKE these medications    enoxaparin 40 MG/0.4ML injection Commonly known as: LOVENOX Inject 0.4 mLs (40  mg total) into the skin daily for 19 days. Start taking on: February 08, 2021   HYDROcodone-acetaminophen 5-325 MG tablet Commonly known as: NORCO/VICODIN Take 1 tablet by mouth every 6 (six) hours as needed for moderate pain.   Hypromellose 0.4 % Soln Apply 1 drop to eye daily as needed (for dry eyes).   lisinopril 10 MG tablet Commonly known as: ZESTRIL Take 5 mg by mouth as needed (for systolic blood pressure of greater than 160).   PRESERVISION AREDS 2 PO Take 1 capsule by mouth 2 (two) times daily.        Follow-up Information     [facility] Follow up.   Why: to provide home health physical therapy Contact information: [phone]        Armond Hang, MD Follow up in 1 week(s).   Specialty: Orthopedic Surgery Contact information: [address] [place] [address] [phone]                 Discharge Exam: Danley Danker Weights   02/05/21 1550  Weight: 59 kg   General-appears in no acute distress Heart-S1-S2, regular, no murmur auscultated Lungs-clear to auscultation bilaterally, no wheezing or crackles auscultated Abdomen-soft, nontender, no organomegaly Extremities-no edema in the lower extremities Neuro-alert, oriented x3, no focal deficit noted at  Condition at discharge: good  The results of significant diagnostics from  this hospitalization (including imaging, microbiology, ancillary and laboratory) are listed below for reference.   Imaging Studies: CT Head Wo Contrast  Result Date: 02/05/2021 CLINICAL DATA:  Blunt poly trauma. Status post fall at home. [address] and hit her head. EXAM: CT HEAD WITHOUT CONTRAST CT CERVICAL SPINE WITHOUT CONTRAST TECHNIQUE: Multidetector CT imaging of the head and cervical spine was performed following the standard protocol without intravenous contrast. Multiplanar CT image reconstructions of the cervical spine were also generated. RADIATION DOSE REDUCTION: This exam was performed  according to the departmental dose-optimization program which includes automated exposure control, adjustment of the mA and/or kV according to patient size and/or use of iterative reconstruction technique. COMPARISON:  None. FINDINGS: Brain: No evidence of acute infarction, hemorrhage, extra-axial collection, ventriculomegaly, or mass effect. Generalized cerebral atrophy. Periventricular white matter low attenuation likely secondary to microangiopathy. Vascular: No significant cerebrovascular atherosclerotic calcifications are noted. No hyperdense vessels. Skull: Negative for fracture or focal lesion. Sinuses/Orbits: Visualized portions of the orbits are unremarkable. Visualized portions of the paranasal sinuses are unremarkable. Visualized portions of the mastoid air cells are unremarkable. Other: None. CT CERVICAL SPINE FINDINGS Alignment: Minimal grade 1 anterolisthesis of C4 on C5, C6 on C7, and C7 on T1 secondary to facet disease. Skull base and vertebrae: No acute fracture. No primary bone lesion or focal pathologic process. Soft tissues and spinal canal: No prevertebral fluid or swelling. No visible canal hematoma. Disc levels: Degenerative disease with disc height loss C2-3, C3-4, C4-5, C5-6, T2-3, and T3-4. calcification of the disc at C2-3. Ankylosis across the disc space at T2-3. Ankylosis of the C2-3 posterior elements bilaterally. Severe left facet arthropathy and mild right facet arthropathy throughout the cervical spine. Bilateral foraminal stenosis at C3-4, C4-5, C5-6 and C6-7. Upper chest: Lung apices are clear. Other: No fluid collection or hematoma. IMPRESSION: 1. No acute intracranial pathology. 2. No acute fracture or subluxation of the cervical spine. 3. Diffuse cervical spine spondylosis as described above. Electronically Signed   By: Kathreen Devoid M.D.   On: 02/05/2021 10:58   CT Cervical Spine Wo Contrast  Result Date: 02/05/2021 CLINICAL DATA:  Blunt poly trauma. Status post fall at home.  [place] [address] and hit her head. EXAM: CT HEAD WITHOUT CONTRAST CT CERVICAL SPINE WITHOUT CONTRAST TECHNIQUE: Multidetector CT imaging of the head and cervical spine was performed following the standard protocol without intravenous contrast. Multiplanar CT image reconstructions of the cervical spine were also generated. RADIATION DOSE REDUCTION: This exam was performed according to the departmental dose-optimization program which includes automated exposure control, adjustment of the mA and/or kV according to patient size and/or use of iterative reconstruction technique. COMPARISON:  None. FINDINGS: Brain: No evidence of acute infarction, hemorrhage, extra-axial collection, ventriculomegaly, or mass effect. Generalized cerebral atrophy. Periventricular white matter low attenuation likely secondary to microangiopathy. Vascular: No significant cerebrovascular atherosclerotic calcifications are noted. No hyperdense vessels. Skull: Negative for fracture or focal lesion. Sinuses/Orbits: Visualized portions of the orbits are unremarkable. Visualized portions of the paranasal sinuses are unremarkable. Visualized portions of the mastoid air cells are unremarkable. Other: None. CT CERVICAL SPINE FINDINGS Alignment: Minimal grade 1 anterolisthesis of C4 on C5, C6 on C7, and C7 on T1 secondary to facet disease. Skull base and vertebrae: No acute fracture. No primary bone lesion or focal pathologic process. Soft tissues and spinal canal: No prevertebral fluid or swelling. No visible canal hematoma. Disc levels: Degenerative disease with disc height loss C2-3, C3-4, C4-5, C5-6, T2-3, and T3-4. calcification of the disc at C2-3.  Ankylosis across the disc space at T2-3. Ankylosis of the C2-3 posterior elements bilaterally. Severe left facet arthropathy and mild right facet arthropathy throughout the cervical spine. Bilateral foraminal stenosis at C3-4, C4-5, C5-6 and C6-7. Upper chest: Lung apices are clear. Other: No fluid collection or  hematoma. IMPRESSION: 1. No acute intracranial pathology. 2. No acute fracture or subluxation of the cervical spine. 3. Diffuse cervical spine spondylosis as described above. Electronically Signed   By: Kathreen Devoid M.D.   On: 02/05/2021 10:58   CT PELVIS WO CONTRAST  Result Date: 02/05/2021 CLINICAL DATA:  Evaluate sclerotic lesion. Status post right hip fracture EXAM: CT PELVIS WITHOUT CONTRAST TECHNIQUE: Multidetector CT imaging of the pelvis was performed following the standard protocol without intravenous contrast. RADIATION DOSE REDUCTION: This exam was performed according to the departmental dose-optimization program which includes automated exposure control, adjustment of the mA and/or kV according to patient size and/or use of iterative reconstruction technique. COMPARISON:  04/06/2012 FINDINGS: Urinary Tract:  No abnormality visualized. Bowel: Sigmoid diverticulosis. No bowel wall thickening, inflammation, or distension. Vascular/Lymphatic: Extensive calcified atherosclerotic disease is noted involving the iliac vessels. No aneurysm identified. No enlarged lymph nodes. Reproductive: The uterus appears surgically absent. No adnexal mass identified Other:  No free fluid or fluid collections identified. Musculoskeletal: Status post left total hip arthroplasty. Hardware components are in anatomic alignment. No signs of periprosthetic fracture or dislocation. Impacted subcapital right femoral neck fracture is identified. No signs of dislocation. Well-defined sclerotic lesion within the right iliac bone has a narrow zone of transition measuring 1.6 cm in maximum dimension, image 41/6. As noted on study from earlier today this has increased in size compared with plain film radiograph dated 04/06/2012. On 04/06/2012 there is a 5 mm sclerotic lesion in this area. No additional focal bone lesions identified. IMPRESSION: 1. Impacted subcapital right femoral neck fracture. 2. Well-defined sclerotic lesion within  the right iliac bone has increased in size compared with plain film radiograph dated 04/06/2012. This is of doubtful acute clinical significance. In the absence of a known malignancy this likely represents a benign bone island. A nonemergent, outpatient nuclear medicine whole-body bone scan may be considered to assess for uptake in this area as well as assess for additional lesions. 3. Status post left total hip arthroplasty without signs of periprosthetic fracture or dislocation. 4. Sigmoid diverticulosis without evidence for acute diverticulitis. 5. Aortic Atherosclerosis (ICD10-I70.0). Electronically Signed   By: Kerby Moors M.D.   On: 02/05/2021 13:38   DG C-Arm 1-60 Min-No Report  Result Date: 02/05/2021 Fluoroscopy was utilized by the requesting physician.  No radiographic interpretation.   DG C-Arm 1-60 Min-No Report  Result Date: 02/05/2021 Fluoroscopy was utilized by the requesting physician.  No radiographic interpretation.   DG HIP OPERATIVE UNILAT W OR W/O PELVIS RIGHT  Result Date: 02/05/2021 CLINICAL DATA:  Right femoral fracture EXAM: OPERATIVE RIGHT HIP WITH PELVIS COMPARISON:  02/05/2021 FLUOROSCOPY TIME:  Radiation Exposure Index (as provided by the fluoroscopic device): 44.75 mGy If the device does not provide the exposure index: Fluoroscopy Time:  4 minutes 32 seconds Number of Acquired Images:  7 FINDINGS: Initial images again demonstrate the subcapital femoral neck fracture. Three cannulated screws within placed traversing the femoral neck into the femoral head. No soft tissue abnormality is noted. IMPRESSION: ORIF of right femoral neck fracture. Electronically Signed   By: Inez Catalina M.D.   On: 02/05/2021 20:03   DG Hip Unilat With Pelvis 2-3 Views Right  Result Date: 02/05/2021  CLINICAL DATA:  Trauma, fall EXAM: DG HIP (WITH OR WITHOUT PELVIS) 2-3V RIGHT COMPARISON:  03/29/2012 FINDINGS: There is disruption of trabecula in the subcapital portion of neck of proximal right  femur. There is cortical irregularity in the lateral aspect of subcapital portion of proximal right femur. There is previous left hip arthroplasty. There is 2.1 cm sclerotic density in the right iliac bone which has increased in size. IMPRESSION: Impacted subcapital fracture of neck of right femur. Previous left hip arthroplasty. There is 2.1 cm sclerotic density in the right iliac bone which appears larger in size in comparison with the examination of 03/29/2012. This may suggest a benign bone island or sclerotic metastatic disease. Electronically Signed   By: Elmer Picker M.D.   On: 02/05/2021 10:50    Microbiology: Results for orders placed or performed during the hospital encounter of 02/05/21  Resp Panel by RT-PCR (Flu A&B, Covid) Nasopharyngeal Swab     Status: None   Collection Time: 02/05/21 11:35 AM   Specimen: Nasopharyngeal Swab; Nasopharyngeal(NP) swabs in vial transport medium  Result Value Ref Range Status   SARS Coronavirus 2 by RT PCR NEGATIVE NEGATIVE Final    Comment: (NOTE) SARS-CoV-2 target nucleic acids are NOT DETECTED.  The SARS-CoV-2 RNA is generally detectable in upper respiratory specimens during the acute phase of infection. The lowest concentration of SARS-CoV-2 viral copies this assay can detect is 138 copies/mL. A negative result does not preclude SARS-Cov-2 infection and should not be used as the sole basis for treatment or other patient management decisions. A negative result may occur with  improper specimen collection/handling, submission of specimen other than nasopharyngeal swab, presence of viral mutation(s) within the areas targeted by this assay, and inadequate number of viral copies(<138 copies/mL). A negative result must be combined with clinical observations, patient history, and epidemiological information. The expected result is Negative.  Fact Sheet for Patients:  EntrepreneurPulse.com.au  Fact Sheet for Healthcare  Providers:  IncredibleEmployment.be  This test is no t yet approved or cleared by the [place] FDA and  has been authorized for detection and/or diagnosis of SARS-CoV-2 by FDA under an Emergency Use Authorization (EUA). This EUA will remain  in effect (meaning this test can be used) for the duration of the COVID-19 declaration under Section 564(b)(1) of the Act, 21 U.S.C.section 360bbb-3(b)(1), unless the authorization is terminated  or revoked sooner.       Influenza A by PCR NEGATIVE NEGATIVE Final   Influenza B by PCR NEGATIVE NEGATIVE Final    Comment: (NOTE) The Xpert Xpress SARS-CoV-2/FLU/RSV plus assay is intended as an aid in the diagnosis of influenza from Nasopharyngeal swab specimens and should not be used as a sole basis for treatment. Nasal washings and aspirates are unacceptable for Xpert Xpress SARS-CoV-2/FLU/RSV testing.  Fact Sheet for Patients: EntrepreneurPulse.com.au  Fact Sheet for Healthcare Providers: IncredibleEmployment.be  This test is not yet approved or cleared by the [place] FDA and has been authorized for detection and/or diagnosis of SARS-CoV-2 by FDA under an Emergency Use Authorization (EUA). This EUA will remain in effect (meaning this test can be used) for the duration of the COVID-19 declaration under Section 564(b)(1) of the Act, 21 U.S.C. section 360bbb-3(b)(1), unless the authorization is terminated or revoked.  Performed at [facility], [place] [address], [place], [place] [address]   Surgical PCR Screen     Status: None   Collection Time: 02/05/21  4:35 PM   Specimen: Nasal Mucosa; Nasal Swab  Result  Value Ref Range Status   MRSA, PCR NEGATIVE NEGATIVE Final   Staphylococcus aureus NEGATIVE NEGATIVE Final    Comment: (NOTE) The Xpert SA Assay (FDA approved for NASAL specimens in patients 77 years of age and older), is one component of a  comprehensive surveillance program. It is not intended to diagnose infection nor to guide or monitor treatment. Performed at [facility], [place] [address], [place],  [address]     Labs: CBC: Recent Labs  Lab 02/05/21 1135 02/06/21 0321  WBC 13.8* 10.8*  NEUTROABS 11.6*  --   HGB 12.4 11.9*  HCT 38.2 36.7  MCV 88.4 88.0  PLT 215 845   Basic Metabolic Panel: Recent Labs  Lab 02/05/21 1135 02/06/21 0321  NA 136 135  K 4.3 3.8  CL 102 102  CO2 26 24  GLUCOSE 95 151*  BUN 13 12  CREATININE 0.70 0.58   0.69  CALCIUM 8.6* 8.6*   Liver Function Tests: No results for input(s): AST, ALT, ALKPHOS, BILITOT, PROT, ALBUMIN in the last 168 hours. CBG: No results for input(s): GLUCAP in the last 168 hours.  Discharge time spent: greater than 30 minutes.  Signed: Oswald Hillock, MD [facility] 02/07/2021

## 2021-02-07 NOTE — Plan of Care (Signed)
°  Problem: Education: Goal: Knowledge of General Education information will improve Description: Including pain rating scale, medication(s)/side effects and non-pharmacologic comfort measures Outcome: Adequate for Discharge   Problem: Health Behavior/Discharge Planning: Goal: Ability to manage health-related needs will improve Outcome: Adequate for Discharge   Problem: Clinical Measurements: Goal: Ability to maintain clinical measurements within normal limits will improve Outcome: Adequate for Discharge Goal: Will remain free from infection Outcome: Adequate for Discharge Goal: Diagnostic test results will improve Outcome: Adequate for Discharge Goal: Respiratory complications will improve Outcome: Adequate for Discharge Goal: Cardiovascular complication will be avoided Outcome: Adequate for Discharge   Problem: Activity: Goal: Risk for activity intolerance will decrease Outcome: Adequate for Discharge   Problem: Coping: Goal: Level of anxiety will decrease Outcome: Adequate for Discharge   Problem: Elimination: Goal: Will not experience complications related to bowel motility Outcome: Adequate for Discharge   Problem: Pain Managment: Goal: General experience of comfort will improve Outcome: Adequate for Discharge   Problem: Safety: Goal: Ability to remain free from injury will improve Outcome: Adequate for Discharge   Problem: Skin Integrity: Goal: Risk for impaired skin integrity will decrease Outcome: Adequate for Discharge   Problem: Education: Goal: Verbalization of understanding the information provided (i.e., activity precautions, restrictions, etc) will improve Outcome: Adequate for Discharge   Problem: Activity: Goal: Ability to ambulate and perform ADLs will improve Outcome: Adequate for Discharge   Problem: Clinical Measurements: Goal: Postoperative complications will be avoided or minimized Outcome: Adequate for Discharge   Problem:  Self-Concept: Goal: Ability to maintain and perform role responsibilities to the fullest extent possible will improve Outcome: Adequate for Discharge   Problem: Pain Management: Goal: Pain level will decrease Outcome: Adequate for Discharge

## 2021-02-07 NOTE — Plan of Care (Signed)
Plan of care reviewed and discussed with the patient. 

## 2021-02-07 NOTE — Progress Notes (Signed)
Physical Therapy Treatment Patient Details Name: Alyssa [name] MRN: [id number] DOB: [birth year] Today's Date: 02/07/2021   History of Present Illness Pt admitted s/p fall with R femoral neck fx and now s/p closed reductioin and percutaneous screw fixation.  Pt with hx of L BB, CVA, Tinnitus and L THR (14)    PT Comments    Pt continues very cooperative and with min pain.  Pt up to bathroom for toileting and hygiene at sink with assist of dtr, ambulated in hall, negotiated stairs, and performed HEP.  Pt eager for dc home asap.   Recommendations for follow up therapy are one component of a multi-disciplinary discharge planning process, led by the attending physician.  Recommendations may be updated based on patient status, additional functional criteria and insurance authorization.  Follow Up Recommendations  Home health PT     Assistance Recommended at Discharge Intermittent Supervision/Assistance  Patient can return home with the following A little help with walking and/or transfers;A little help with bathing/dressing/bathroom;Assistance with cooking/housework;Assist for transportation;Help with stairs or ramp for entrance   Equipment Recommendations  None recommended by PT    Recommendations for Other Services       Precautions / Restrictions Precautions Precautions: Fall Restrictions Weight Bearing Restrictions: No Other Position/Activity Restrictions: WBAT     Mobility  Bed Mobility               General bed mobility comments: Up in chair and requests back to same    Transfers Overall transfer level: Needs assistance Equipment used: Rolling walker (2 wheels) Transfers: Sit to/from Stand Sit to Stand: Supervision           General transfer comment: cues for LE management and use of UEs to self assist    Ambulation/Gait Ambulation/Gait assistance: Min guard, Supervision Gait Distance (Feet): 120 Feet Assistive device: Rolling walker (2 wheels) Gait  Pattern/deviations: Step-to pattern, Step-through pattern, Decreased step length - right, Decreased step length - left, Shuffle, Trunk flexed       General Gait Details: cues for posture, position from RW and initial sequence   Stairs Stairs: Yes Stairs assistance: Min guard Stair Management: One rail Right, Sideways, Step to pattern Number of Stairs: 2     Wheelchair Mobility    Modified Rankin (Stroke Patients Only)       Balance Overall balance assessment: Needs assistance Sitting-balance support: No upper extremity supported, Feet supported Sitting balance-Leahy Scale: Good     Standing balance support: No upper extremity supported Standing balance-Leahy Scale: Fair                              Cognition Arousal/Alertness: Awake/alert Behavior During Therapy: WFL for tasks assessed/performed Overall Cognitive Status: Within Functional Limits for tasks assessed                                          Exercises General Exercises - Lower Extremity Ankle Circles/Pumps: AROM, Both, 15 reps, Supine Quad Sets: AROM, Both, 10 reps, Supine Long Arc Quad: AROM, Right, 10 reps, Seated Heel Slides: AAROM, Right, 20 reps, Supine Hip ABduction/ADduction: AAROM, Right, 15 reps, Supine    General Comments        Pertinent Vitals/Pain Pain Assessment Pain Assessment: 0-10 Pain Score: 1  Pain Location: R hip Pain Descriptors / Indicators: Sore Pain Intervention(s): Limited activity  within patient's tolerance, Monitored during session    Home Living                          Prior Function            PT Goals (current goals can now be found in the care plan section) Acute Rehab PT Goals Patient Stated Goal: Regain IND PT Goal Formulation: With patient Time For Goal Achievement: 02/13/21 Potential to Achieve Goals: Good Progress towards PT goals: Progressing toward goals    Frequency    Min 5X/week      PT Plan  Current plan remains appropriate    Co-evaluation              AM-PAC PT "6 Clicks" Mobility   Outcome Measure  Help needed turning from your back to your side while in a flat bed without using bedrails?: A Little Help needed moving from lying on your back to sitting on the side of a flat bed without using bedrails?: A Little Help needed moving to and from a bed to a chair (including a wheelchair)?: A Little Help needed standing up from a chair using your arms (e.g., wheelchair or bedside chair)?: A Little Help needed to walk in hospital room?: A Little Help needed climbing 3-5 steps with a railing? : A Little 6 Click Score: 18    End of Session Equipment Utilized During Treatment: Gait belt Activity Tolerance: Patient tolerated treatment well Patient left: in chair;with call bell/phone within reach;with chair alarm set;with family/visitor present Nurse Communication: Mobility status PT Visit Diagnosis: Unsteadiness on feet (R26.81);Difficulty in walking, not elsewhere classified (R26.2)     Time: 7517-0017 PT Time Calculation (min) (ACUTE ONLY): 30 min  Charges:  $Gait Training: 8-22 mins $Therapeutic Exercise: 8-22 mins                     [place] Pager [phone] Office [phone]    [facility] 02/07/2021, 9:23 AM

## 2021-06-19 ENCOUNTER — Encounter (INDEPENDENT_AMBULATORY_CARE_PROVIDER_SITE_OTHER): Payer: Self-pay | Admitting: Ophthalmology

## 2021-06-19 ENCOUNTER — Ambulatory Visit (INDEPENDENT_AMBULATORY_CARE_PROVIDER_SITE_OTHER): Payer: Medicare Other | Admitting: Ophthalmology

## 2021-06-19 DIAGNOSIS — H353112 Nonexudative age-related macular degeneration, right eye, intermediate dry stage: Secondary | ICD-10-CM | POA: Diagnosis not present

## 2021-06-19 DIAGNOSIS — H353211 Exudative age-related macular degeneration, right eye, with active choroidal neovascularization: Secondary | ICD-10-CM

## 2021-06-19 DIAGNOSIS — H35033 Hypertensive retinopathy, bilateral: Secondary | ICD-10-CM

## 2021-06-19 DIAGNOSIS — I10 Essential (primary) hypertension: Secondary | ICD-10-CM

## 2021-06-19 DIAGNOSIS — Z961 Presence of intraocular lens: Secondary | ICD-10-CM

## 2021-06-19 MED ORDER — BEVACIZUMAB CHEMO INJECTION 1.25MG/0.05ML SYRINGE FOR KALEIDOSCOPE
1.2500 mg | INTRAVITREAL | Status: AC | PRN
  Administered 2021-06-19: 1.25 mg via INTRAVITREAL

## 2021-06-19 NOTE — Progress Notes (Signed)
[facility] Note  06/19/2021     CHIEF COMPLAINT Patient presents for Retina Evaluation   HISTORY OF PRESENT ILLNESS: Alyssa [name] is a 88 y.o. female who presents to the clinic today for:   HPI     Retina Evaluation   In both eyes (Hx of injections ).  This started years ago.  Duration of years.  Associated Symptoms Negative for Flashes and Floaters.  Context:  distance vision.  I, the attending physician,  performed the HPI with the patient and updated documentation appropriately.        Comments   Patient states that she has a history of injections in both eyes. She feels that her distance vision is the problem.       Last edited by Bernarda Caffey, MD on 06/19/2021  2:03 PM.    Injection q3 months x1 year.  Last injection March 06, 2021. Laser for NAG OU. Denies stroke or heart attach.  Hip replacement from fall.   Referring physician: Clent Jacks, MD [address] [place],  [place] [address]  HISTORICAL INFORMATION:   Selected notes from the MEDICAL RECORD NUMBER Referred by Dr. Elliot Dally -- pt had cataract surgery with him in the past, not actually a current pt LEE:  Ocular Hx- PMH-    CURRENT MEDICATIONS: Current Outpatient Medications (Ophthalmic Drugs)  Medication Sig   Hypromellose 0.4 % SOLN Apply 1 drop to eye daily as needed (for dry eyes). (Patient not taking: Reported on 06/19/2021)   No current facility-administered medications for this visit. (Ophthalmic Drugs)   Current Outpatient Medications (Other)  Medication Sig   enoxaparin (LOVENOX) 40 MG/0.4ML injection Inject 0.4 mLs (40 mg total) into the skin daily for 19 days.   HYDROcodone-acetaminophen (NORCO/VICODIN) 5-325 MG tablet Take 1 tablet by mouth every 6 (six) hours as needed for moderate pain. (Patient not taking: Reported on 06/19/2021)   lisinopril (PRINIVIL,ZESTRIL) 10 MG tablet Take 5 mg by mouth as needed (for systolic blood pressure of greater than  160). (Patient not taking: Reported on 06/19/2021)   Multiple Vitamins-Minerals (PRESERVISION AREDS 2 PO) Take 1 capsule by mouth 2 (two) times daily. (Patient not taking: Reported on 06/19/2021)   No current facility-administered medications for this visit. (Other)   REVIEW OF SYSTEMS: ROS   Positive for: Eyes Negative for: Constitutional, Gastrointestinal, Neurological, Skin, Genitourinary, Musculoskeletal, HENT, Endocrine, Cardiovascular, Respiratory, Psychiatric, Allergic/Imm, Heme/Lymph Last edited by Leonie Douglas, COA on 06/19/2021  9:55 AM.     ALLERGIES Allergies  Allergen Reactions   Penicillins Swelling    "probably 1970; lips and tongue felt but didn't look swollen; dr told me not to take it again"   Tape Other (See Comments)    Tears skin   PAST MEDICAL HISTORY Past Medical History:  Diagnosis Date   Arthritis    [facility])    "female; not sure if uterus or endometrosis"   Cough    occasional   Glaucoma, right eye    lens implant done for 4 to  yrs ago   High cholesterol    03/23/11 'don't take anything for it"   Hypertension    Left bundle branch block    Stroke ([place]) 03/23/11   "mini strokes; years ago; didn't go to dr;  Rene Paci had babies since then"   Thyroid disease    S/P thyroidectomy   Tinnitus of both ears    Past Surgical History:  Procedure Laterality Date  ABDOMINAL HYSTERECTOMY  1970   APPENDECTOMY  ~ 1947   BREAST ENHANCEMENT SURGERY  ~ 1980   BREAST SURGERY     cadaver bone  2011   right small finger; "took more than cyst out too"   CATARACT EXTRACTION W/ INTRAOCULAR LENS IMPLANT  ~ 2010   right   ENDOVENOUS ABLATION SAPHENOUS VEIN W/ LASER  04/14/2010   left; GSV   EYE SURGERY     HIP PINNING,CANNULATED Right 02/05/2021   Procedure: CANNULATED HIP PINNING;  Surgeon: Armond Hang, MD;  Location: [facility];  Service: Orthopedics;  Laterality: Right;   INTRACAPSULAR CATARACT EXTRACTION  ~ 2010   left   Stab Phlebectomies   06/03/10   R leg;  "for varicose veins"   THYROIDECTOMY  ~ 1970's   TONSILLECTOMY  1943   TOTAL HIP ARTHROPLASTY Left 04/06/2012   Procedure: TOTAL HIP ARTHROPLASTY;  Surgeon: Gearlean Alf, MD;  Location: [facility];  Service: Orthopedics;  Laterality: Left;   TUBAL LIGATION  yrs ago   FAMILY HISTORY History reviewed. No pertinent family history.  SOCIAL HISTORY Social History   Tobacco Use   Smoking status: Former    Types: Cigarettes    Quit date: 01/05/1970    Years since quitting: 51.4   Smokeless tobacco: Never   Tobacco comments:    03/23/11 'never really had habit of smoking; I have smoked a few cigarettes"  Substance Use Topics   Alcohol use: No   Drug use: No       OPHTHALMIC EXAM:  Base Eye Exam     Visual Acuity (Snellen - Linear)       Right Left   Dist [place] 20/50 +1 20/50 +2   Dist ph [place] NI 20/40 +1         Tonometry (Tonopen, 9:00 AM)       Right Left   Pressure 16 18         Pupils       Pupils Dark Light Shape React APD   Right PERRL 3 2 Round Minimal None   Left PERRL 3 2 Round Minimal None         Visual Fields       Left Right    Full Full         Extraocular Movement       Right Left    Full, Ortho Full, Ortho         Neuro/Psych     Oriented x3: Yes         Dilation     Both eyes: 1.0% Mydriacyl, 2.5% Phenylephrine @ 8:49 AM           Slit Lamp and Fundus Exam     External Exam       Right Left   External Normal Normal         Slit Lamp Exam       Right Left   Lids/Lashes Dermatochalasis - upper lid, Meibomian gland dysfunction Dermatochalasis - upper lid, Meibomian gland dysfunction   Conjunctiva/Sclera White and quiet White and quiet   Cornea Arcus, Well healed temporal cataract wound 1+ fine Punctate epithelial erosions   Anterior Chamber Deep and clear, narrow temporal angle Deep and clear   Iris Round and dilated, patent [id number] Round and dilated, patent PI [id number]   Lens PC IOL toric in good position with marks at  1145 and 0545, trace Posterior capsular opacification PC IOL in good position   Anterior Vitreous  Vitreous syneresis Vitreous syneresis         Fundus Exam       Right Left   Disc mild Pallor, Sharp rim Mild Pallor, Sharp rim, Compact   C/D Ratio 0.2 0.2   Macula Flat, Blunted foveal reflex, Drusen, RPE mottling and clumping, central fibrosis -- mild, , No heme or edema Flat, Blunted foveal reflex, Drusen, RPE mottling, clumping, and early atrophy   Vessels Attenuated, Tortuous Attenuated, Tortuous, focal corkscrew vessel off of ST venule, severe periphery attenuation superior quad - ?old BRVO superiorly   Periphery Attached, mild Reticular degeneration, subretinal exudation and pigmented CR scarring temporal midzone Attached           Refraction     Manifest Refraction (Auto)       Sphere Cylinder Axis Dist VA   Right -1.50 +1.25 178    Left -1.50 +0.75 174          Manifest Refraction #2       Sphere Cylinder Axis Dist VA   Right -1.50 +1.00 178 20/30   Left -1.75 +0.75 174 20/20+2            IMAGING AND PROCEDURES  Imaging and Procedures for 06/19/2021  OCT, Retina - OU - Both Eyes       Right Eye Quality was good. Central Foveal Thickness: 244. Progression has no prior data. Findings include no IRF, no SRF, abnormal foveal contour, retinal drusen , pigment epithelial detachment, outer retinal atrophy (Central PED with overlying patchy ORA--no fluid).   Left Eye Quality was good. Central Foveal Thickness: 262. Progression has no prior data. Findings include normal foveal contour, no IRF, no SRF, retinal drusen , pigment epithelial detachment (Low lying PED-- no fluid).   Notes *Images captured and stored on drive  Diagnosis / Impression:  OD: Exu ARMD -- central low PED OS: Nonexudative ARMD NFP; no IRF/SRF OU  Clinical management:  See below  Abbreviations: NFP - Normal foveal profile. CME - cystoid macular edema. PED - pigment epithelial  detachment. IRF - intraretinal fluid. SRF - subretinal fluid. EZ - ellipsoid zone. ERM - epiretinal membrane. ORA - outer retinal atrophy. ORT - outer retinal tubulation. SRHM - subretinal hyper-reflective material. IRHM - intraretinal hyper-reflective material      Intravitreal Injection, Pharmacologic Agent - OD - Right Eye       Time Out 06/19/2021. 10:24 AM. Confirmed correct patient, procedure, site, and patient consented.   Anesthesia Topical anesthesia was used. Anesthetic medications included Lidocaine 2%, Proparacaine 0.5%.   Procedure Preparation included 5% betadine to ocular surface, eyelid speculum. A supplied needle was used.   Injection: 1.25 mg Bevacizumab 1.'25mg'$ /0.80m   Route: Intravitreal, Site: Right Eye   NDC: [id number] Lot:: [id number] Expiration date: 08/02/2021   Post-op Post injection exam found visual acuity of at least counting fingers. The patient tolerated the procedure well. There were no complications. The patient received written and verbal post procedure care education. Post injection medications were not given.            ASSESSMENT/PLAN:    ICD-10-CM   1. Exudative age-related macular degeneration of right eye with active choroidal neovascularization (HCC)  H35.3211 OCT, Retina - OU - Both Eyes    Intravitreal Injection, Pharmacologic Agent - OD - Right Eye    Bevacizumab (AVASTIN) SOLN 1.25 mg    2. Intermediate stage nonexudative age-related macular degeneration of right eye  H35.3112     3. Essential hypertension  I10  4. Hypertensive retinopathy of both eyes  H35.033     5. Pseudophakia  Z96.1      1. Exudative age related macular degeneration, OD  - former pt of Dr. Marlon Pel at [facility] -- pt lives in [place] -- history of q12wk IVA OD -- last injection was IVA OD 3.2.23  - The incidence pathology and anatomy of wet AMD discussed   - discussed treatment options including observation vs intravitreal anti-VEGF agents such as  Avastin, Lucentis, Eylea.    - Risks of endophthalmitis and vascular occlusive events and atrophic changes discussed with patient  - OCT shows OD Central PED with overlying patchy ORA--no fluid (at 15 wks since last IVA) - recommend IVA OD #1 for maintenance therapy -- continuing prior regimen - pt wishes to be treated with IVA - RBA of procedure discussed, questions answered  - informed consent obtained and signed, OD 06.15.23 - see procedure note  - f/u in 6 wks -- DFE/OCT, FA (transit OD) , possible injection  2. Age related macular degeneration, non-exudative, OS  - The incidence, anatomy, and pathology of dry AMD, risk of progression, and the AREDS and AREDS 2 studies including smoking risks discussed with patient.  - Recommend amsler grid monitoring  - monitoring   3,4. Hypertensive retinopathy OU - discussed importance of tight BP control - ?old superior BRVO OS, will get a FA next visit - monitor   5. Pseudophakia OU  - s/p CE/IOL OU (Dr. Elliot Dally, unknown date)  - IOLs in good position, doing well  - monitor     Ophthalmic Meds Ordered this visit:  Meds ordered this encounter  Medications   Bevacizumab (AVASTIN) SOLN 1.25 mg     Return in about 6 weeks (around 07/31/2021) for Return in 6 weeks DFE, OCT, possible injection, FA (transit OS).  There are no Patient Instructions on file for this visit.  Explained the diagnoses, plan, and follow up with the patient and they expressed understanding.  Patient expressed understanding of the importance of proper follow up care.   This document serves as a record of services personally performed by Gardiner Sleeper, MD, PhD. It was created on their behalf by Leonie [place], an ophthalmic technician. The creation of this record is the provider's dictation and/or activities during the visit.    Electronically signed by: Leonie Douglas COA, 06/19/21  2:14 PM  Gardiner Sleeper, M.D., Ph.D. Diseases & Surgery of the Retina and  Vitreous [facility]  I have reviewed the above documentation for accuracy and completeness, and I agree with the above. Gardiner Sleeper, M.D., Ph.D. 06/19/21 2:14 PM  Abbreviations: M myopia (nearsighted); A astigmatism; H hyperopia (farsighted); P presbyopia; Mrx spectacle prescription;  CTL contact lenses; OD right eye; OS left eye; OU both eyes  XT exotropia; ET esotropia; PEK punctate epithelial keratitis; PEE punctate epithelial erosions; DES dry eye syndrome; MGD meibomian gland dysfunction; ATs artificial tears; PFAT's preservative free artificial tears; [place] nuclear sclerotic cataract; PSC posterior subcapsular cataract; ERM epi-retinal membrane; PVD posterior vitreous detachment; RD retinal detachment; DM diabetes mellitus; DR diabetic retinopathy; NPDR non-proliferative diabetic retinopathy; PDR proliferative diabetic retinopathy; CSME clinically significant macular edema; DME diabetic macular edema; dbh dot blot hemorrhages; CWS cotton wool spot; POAG primary open angle glaucoma; C/D cup-to-disc ratio; HVF humphrey visual field; GVF goldmann visual field; OCT optical coherence tomography; IOP intraocular pressure; BRVO Branch retinal vein occlusion; CRVO central retinal vein occlusion; CRAO central retinal artery occlusion; BRAO  branch retinal artery occlusion; RT retinal tear; SB scleral buckle; PPV pars plana vitrectomy; VH Vitreous hemorrhage; PRP panretinal laser photocoagulation; IVK intravitreal kenalog; VMT vitreomacular traction; MH Macular hole;  NVD neovascularization of the disc; NVE neovascularization elsewhere; AREDS age related eye disease study; ARMD age related macular degeneration; POAG primary open angle glaucoma; EBMD epithelial/anterior basement membrane dystrophy; ACIOL anterior chamber intraocular lens; IOL intraocular lens; PCIOL posterior chamber intraocular lens; Phaco/IOL phacoemulsification with intraocular lens placement; [place] photorefractive  keratectomy; LASIK laser assisted in situ keratomileusis; HTN hypertension; DM diabetes mellitus; COPD chronic obstructive pulmonary disease

## 2021-07-18 NOTE — Progress Notes (Signed)
[facility] Note  07/31/2021    CHIEF COMPLAINT Patient presents for Retina Follow Up   HISTORY OF PRESENT ILLNESS: Alyssa [name] is a 88 y.o. female who presents to the clinic today for:   HPI     Retina Follow Up   Patient presents with  Wet AMD.  In right eye.  Severity is moderate.  Duration of 6 weeks.  Since onset it is stable.  I, the attending physician,  performed the HPI with the patient and updated documentation appropriately.        Comments   Patient states no change in vision OU.      Last edited by Bernarda Caffey, MD on 07/31/2021 10:30 PM.     Pt states VA seems the same.   Referring physician: Ulyses Southward, MD No address on file  HISTORICAL INFORMATION:   Selected notes from the MEDICAL RECORD NUMBER Referred by Dr. Elliot Dally -- pt had cataract surgery with him in the past, not actually a current pt LEE:  Ocular Hx- PMH-    CURRENT MEDICATIONS: Current Outpatient Medications (Ophthalmic Drugs)  Medication Sig   Hypromellose 0.4 % SOLN Apply 1 drop to eye daily as needed (for dry eyes). (Patient not taking: Reported on 06/19/2021)   No current facility-administered medications for this visit. (Ophthalmic Drugs)   Current Outpatient Medications (Other)  Medication Sig   Multiple Vitamins-Minerals (PRESERVISION AREDS 2 PO) Take 1 capsule by mouth 2 (two) times daily.   enoxaparin (LOVENOX) 40 MG/0.4ML injection Inject 0.4 mLs (40 mg total) into the skin daily for 19 days.   HYDROcodone-acetaminophen (NORCO/VICODIN) 5-325 MG tablet Take 1 tablet by mouth every 6 (six) hours as needed for moderate pain. (Patient not taking: Reported on 06/19/2021)   lisinopril (PRINIVIL,ZESTRIL) 10 MG tablet Take 5 mg by mouth as needed (for systolic blood pressure of greater than 160). (Patient not taking: Reported on 06/19/2021)   No current facility-administered medications for this visit. (Other)   REVIEW OF SYSTEMS: ROS    Positive for: Eyes Negative for: Constitutional, Gastrointestinal, Neurological, Skin, Genitourinary, Musculoskeletal, HENT, Endocrine, Cardiovascular, Respiratory, Psychiatric, Allergic/Imm, Heme/Lymph Last edited by Jobe Marker, COT on 07/31/2021  1:44 PM.     ALLERGIES Allergies  Allergen Reactions   Penicillins Swelling    "probably 1970; lips and tongue felt but didn't look swollen; dr told me not to take it again"   Tape Other (See Comments)    Tears skin   PAST MEDICAL HISTORY Past Medical History:  Diagnosis Date   Arthritis    Cancer [facility])    "female; not sure if uterus or endometrosis"   Cough    occasional   Glaucoma, right eye    lens implant done for 4 to  yrs ago   High cholesterol    03/23/11 'don't take anything for it"   Hypertension    Left bundle branch block    Stroke ([place]) 03/23/11   "mini strokes; years ago; didn't go to dr;  Rene Paci had babies since then"   Thyroid disease    S/P thyroidectomy   Tinnitus of both ears    Past Surgical History:  Procedure Laterality Date   ABDOMINAL HYSTERECTOMY  1970   APPENDECTOMY  ~ [place]  ~ [place]     cadaver bone  2011   right small finger; "took more than cyst out too"   CATARACT EXTRACTION W/ INTRAOCULAR  LENS IMPLANT  ~ 2010   right   ENDOVENOUS ABLATION SAPHENOUS VEIN W/ LASER  04/14/2010   left; GSV   EYE SURGERY     HIP PINNING,CANNULATED Right 02/05/2021   Procedure: CANNULATED HIP PINNING;  Surgeon: Armond Hang, MD;  Location: [facility];  Service: Orthopedics;  Laterality: Right;   INTRACAPSULAR CATARACT EXTRACTION  ~ 2010   left   Stab Phlebectomies   06/03/10   R leg; "for varicose veins"   THYROIDECTOMY  ~ 1970's   TONSILLECTOMY  1943   TOTAL HIP ARTHROPLASTY Left 04/06/2012   Procedure: TOTAL HIP ARTHROPLASTY;  Surgeon: Gearlean Alf, MD;  Location: [facility];  Service: Orthopedics;  Laterality: Left;   TUBAL LIGATION  yrs ago   FAMILY  HISTORY History reviewed. No pertinent family history.  SOCIAL HISTORY Social History   Tobacco Use   Smoking status: Former    Types: Cigarettes    Quit date: 01/05/1970    Years since quitting: 51.6   Smokeless tobacco: Never   Tobacco comments:    03/23/11 'never really had habit of smoking; I have smoked a few cigarettes"  Substance Use Topics   Alcohol use: No   Drug use: No       OPHTHALMIC EXAM:  Base Eye Exam     Visual Acuity (Snellen - Linear)       Right Left   Dist [place] 20/60 -2 20/50   Dist ph [place] 20/40 -2 20/25 -2         Tonometry (Tonopen, 1:49 PM)       Right Left   Pressure 13 13         Pupils       Dark Light Shape React APD   Right 3 2 Round Brisk None   Left 3 2 Round Brisk None         Visual Fields (Counting fingers)       Left Right    Full Full         Extraocular Movement       Right Left    Full, Ortho Full, Ortho         Neuro/Psych     Oriented x3: Yes   Mood/Affect: Normal         Dilation     Both eyes: 1.0% Mydriacyl, 2.5% Phenylephrine @ 1:49 PM           Slit Lamp and Fundus Exam     External Exam       Right Left   External Normal Normal         Slit Lamp Exam       Right Left   Lids/Lashes Dermatochalasis - upper lid, Meibomian gland dysfunction Dermatochalasis - upper lid, Meibomian gland dysfunction   Conjunctiva/Sclera White and quiet White and quiet   Cornea Arcus, Well healed temporal cataract wound 1+ fine Punctate epithelial erosions   Anterior Chamber Deep and clear, narrow temporal angle Deep and clear   Iris Round and dilated, patent [id number] Round and dilated, patent PI [id number]   Lens PC IOL toric in good position with marks at 1145 and 0545, trace Posterior capsular opacification PC IOL in good position   Anterior Vitreous Vitreous syneresis Vitreous syneresis         Fundus Exam       Right Left   Disc mild Pallor, Sharp rim Mild Pallor, Sharp rim, Compact   C/D Ratio  0.2 0.2   Macula Flat,  Blunted foveal reflex, Drusen, RPE mottling and clumping, central fibrosis -- mild, , No heme or edema Flat, Blunted foveal reflex, Drusen, RPE mottling, clumping, and early atrophy   Vessels Attenuated, Tortuous Attenuated, Tortuous, focal corkscrew vessel off of ST venule, severe periphery attenuation superior quad - ?old BRVO superiorly   Periphery Attached, mild Reticular degeneration, subretinal exudation and pigmented CR scarring temporal midzone Attached           IMAGING AND PROCEDURES  Imaging and Procedures for 07/31/2021  OCT, Retina - OU - Both Eyes       Right Eye Quality was good. Central Foveal Thickness: 264. Progression has been stable. Findings include no IRF, no SRF, abnormal foveal contour, retinal drusen , pigment epithelial detachment, outer retinal atrophy (Central PED with overlying patchy ORA--no fluid--stable).   Left Eye Quality was good. Central Foveal Thickness: 278. Progression has been stable. Findings include normal foveal contour, no IRF, no SRF, retinal drusen , pigment epithelial detachment (Low lying PED-- no fluid).   Notes *Images captured and stored on drive  Diagnosis / Impression:  OD: Exu ARMD -- central low PED OS: Nonexudative ARMD *no IRF/SRF OU  Clinical management:  See below  Abbreviations: NFP - Normal foveal profile. CME - cystoid macular edema. PED - pigment epithelial detachment. IRF - intraretinal fluid. SRF - subretinal fluid. EZ - ellipsoid zone. ERM - epiretinal membrane. ORA - outer retinal atrophy. ORT - outer retinal tubulation. SRHM - subretinal hyper-reflective material. IRHM - intraretinal hyper-reflective material      Fluorescein Angiography Optos (Transit OD)       Right Eye Progression has no prior data. Early phase findings include staining, window defect. Mid/Late phase findings include staining, window defect, choroidal neovascularization.   Left Eye Progression has no prior data.  Early phase findings include staining. Mid/Late phase findings include staining, choroidal neovascularization (Prominent teleangectasias along ST arcades--?old BRVO; Focal staining inferonasal macula ?mild CNV).   Notes **Images stored on drive**  Impression: OD: exudative ARMD w/ +CNV OS: focal telangectasias and tortuous vessels along ST arcades -- ?old BRVO            ASSESSMENT/PLAN:    ICD-10-CM   1. Exudative age-related macular degeneration of right eye with active choroidal neovascularization (HCC)  H35.3211 OCT, Retina - OU - Both Eyes    CANCELED: Fluorescein Angiography Optos (Transit OS)    CANCELED: Intravitreal Injection, Pharmacologic Agent - OD - Right Eye    2. Intermediate stage nonexudative age-related macular degeneration of right eye  H35.3112     3. Essential hypertension  I10     4. Hypertensive retinopathy of both eyes  H35.033 Fluorescein Angiography Optos (Transit OD)    5. Pseudophakia  Z96.1      1. Exudative age related macular degeneration, OD  - s/p IVA OD #1 (06.15.23)  - former pt of Dr. Marlon Pel at [place] -- pt lives in [place] -- history of q12wk IVA OD -- last injection at [facility] was IVA OD 3.2.23  - OCT shows OD Central PED with overlying patchy ORA--no fluid--stable  - FA transit OD (07.27.23) shows +CNV OD; OS w/ early and late staining.  - recommend holding IVA OD today, 07.27.23--VA improved OU, will continue q12 wk IVA OD - informed consent obtained and signed, OD 06.15.23  - f/u in 6 wks -- DFE/OCT, likely injection at 12wk interval.   2. Age related macular degeneration, non-exudative, OS  - The incidence, anatomy, and pathology of dry AMD, risk  of progression, and the AREDS and AREDS 2 studies including smoking risks discussed with patient.  - Recommend amsler grid monitoring  - monitoring   3,4. Hypertensive retinopathy OU - discussed importance of tight BP control - FA 07.27.23 shows telangectasias and tortuous vessels  along ST arcades -- ?old superior BRVO OS - no macular edema - monitor   5. Pseudophakia OU  - s/p CE/IOL OU (Dr. Elliot Dally, unknown date)  - IOLs in good position, doing well  - monitor     Ophthalmic Meds Ordered this visit:  No orders of the defined types were placed in this encounter.    Return in 6 weeks (on 09/11/2021) for exu ARMD OD, DFE, OCT, possible injection.  There are no Patient Instructions on file for this visit.  Explained the diagnoses, plan, and follow up with the patient and they expressed understanding.  Patient expressed understanding of the importance of proper follow up care.   This document serves as a record of services personally performed by Gardiner Sleeper, MD, PhD. It was created on their behalf by San Jetty. Owens Shark, OA an ophthalmic technician. The creation of this record is the provider's dictation and/or activities during the visit.    Electronically signed by: San Jetty. Owens Shark, [place] 07.14.2023 10:39 PM  This document serves as a record of services personally performed by Gardiner Sleeper, MD, PhD. It was created on their behalf by Orvan Falconer, an ophthalmic technician. The creation of this record is the provider's dictation and/or activities during the visit.    Electronically signed by: Orvan Falconer, OA, 07/31/21  10:39 PM  Gardiner Sleeper, M.D., Ph.D. Diseases & Surgery of the Retina and Vitreous [facility]  I have reviewed the above documentation for accuracy and completeness, and I agree with the above. Gardiner Sleeper, M.D., Ph.D. 07/31/21 10:45 PM   Abbreviations: M myopia (nearsighted); A astigmatism; H hyperopia (farsighted); P presbyopia; Mrx spectacle prescription;  CTL contact lenses; OD right eye; OS left eye; OU both eyes  XT exotropia; ET esotropia; PEK punctate epithelial keratitis; PEE punctate epithelial erosions; DES dry eye syndrome; MGD meibomian gland dysfunction; ATs artificial tears; PFAT's  preservative free artificial tears; [place] nuclear sclerotic cataract; PSC posterior subcapsular cataract; ERM epi-retinal membrane; PVD posterior vitreous detachment; RD retinal detachment; DM diabetes mellitus; DR diabetic retinopathy; NPDR non-proliferative diabetic retinopathy; PDR proliferative diabetic retinopathy; CSME clinically significant macular edema; DME diabetic macular edema; dbh dot blot hemorrhages; CWS cotton wool spot; POAG primary open angle glaucoma; C/D cup-to-disc ratio; HVF humphrey visual field; GVF goldmann visual field; OCT optical coherence tomography; IOP intraocular pressure; BRVO Branch retinal vein occlusion; CRVO central retinal vein occlusion; CRAO central retinal artery occlusion; BRAO branch retinal artery occlusion; RT retinal tear; SB scleral buckle; PPV pars plana vitrectomy; VH Vitreous hemorrhage; PRP panretinal laser photocoagulation; IVK intravitreal kenalog; VMT vitreomacular traction; MH Macular hole;  NVD neovascularization of the disc; NVE neovascularization elsewhere; AREDS age related eye disease study; ARMD age related macular degeneration; POAG primary open angle glaucoma; EBMD epithelial/anterior basement membrane dystrophy; ACIOL anterior chamber intraocular lens; IOL intraocular lens; PCIOL posterior chamber intraocular lens; Phaco/IOL phacoemulsification with intraocular lens placement; [place] photorefractive keratectomy; LASIK laser assisted in situ keratomileusis; HTN hypertension; DM diabetes mellitus; COPD chronic obstructive pulmonary disease

## 2021-07-31 ENCOUNTER — Encounter (INDEPENDENT_AMBULATORY_CARE_PROVIDER_SITE_OTHER): Payer: Self-pay | Admitting: Ophthalmology

## 2021-07-31 ENCOUNTER — Encounter (INDEPENDENT_AMBULATORY_CARE_PROVIDER_SITE_OTHER): Payer: Medicare Other | Admitting: Ophthalmology

## 2021-07-31 ENCOUNTER — Ambulatory Visit (INDEPENDENT_AMBULATORY_CARE_PROVIDER_SITE_OTHER): Payer: Medicare Other | Admitting: Ophthalmology

## 2021-07-31 DIAGNOSIS — Z961 Presence of intraocular lens: Secondary | ICD-10-CM

## 2021-07-31 DIAGNOSIS — H353112 Nonexudative age-related macular degeneration, right eye, intermediate dry stage: Secondary | ICD-10-CM | POA: Diagnosis not present

## 2021-07-31 DIAGNOSIS — I10 Essential (primary) hypertension: Secondary | ICD-10-CM | POA: Diagnosis not present

## 2021-07-31 DIAGNOSIS — H353211 Exudative age-related macular degeneration, right eye, with active choroidal neovascularization: Secondary | ICD-10-CM | POA: Diagnosis not present

## 2021-07-31 DIAGNOSIS — H35033 Hypertensive retinopathy, bilateral: Secondary | ICD-10-CM | POA: Diagnosis not present

## 2021-09-04 NOTE — Progress Notes (Signed)
[facility] Note  09/10/2021    CHIEF COMPLAINT Patient presents for Retina Follow Up   HISTORY OF PRESENT ILLNESS: Alyssa [name] is a 88 y.o. female who presents to the clinic today for:   HPI     Retina Follow Up   Patient presents with  Dry AMD.  In right eye.  Severity is moderate.  Duration of 6 weeks.  Since onset it is stable.  I, the attending physician,  performed the HPI with the patient and updated documentation appropriately.        Comments   Pt here for 6 wk ret f/u exu ARMD OD. Pt states [facility] the same, no change.       Last edited by Bernarda Caffey, MD on 09/10/2021  8:51 PM.      Referring physician: Ulyses Southward, MD No address on file  HISTORICAL INFORMATION:   Selected notes from the MEDICAL RECORD NUMBER Referred by Dr. Elliot Dally -- pt had cataract surgery with him in the past, not actually a current pt LEE:  Ocular Hx- PMH-    CURRENT MEDICATIONS: Current Outpatient Medications (Ophthalmic Drugs)  Medication Sig   Hypromellose 0.4 % SOLN Apply 1 drop to eye daily as needed (for dry eyes). (Patient not taking: Reported on 06/19/2021)   No current facility-administered medications for this visit. (Ophthalmic Drugs)   Current Outpatient Medications (Other)  Medication Sig   Multiple Vitamins-Minerals (PRESERVISION AREDS 2 PO) Take 1 capsule by mouth 2 (two) times daily.   enoxaparin (LOVENOX) 40 MG/0.4ML injection Inject 0.4 mLs (40 mg total) into the skin daily for 19 days.   HYDROcodone-acetaminophen (NORCO/VICODIN) 5-325 MG tablet Take 1 tablet by mouth every 6 (six) hours as needed for moderate pain. (Patient not taking: Reported on 06/19/2021)   lisinopril (PRINIVIL,ZESTRIL) 10 MG tablet Take 5 mg by mouth as needed (for systolic blood pressure of greater than 160). (Patient not taking: Reported on 06/19/2021)   No current facility-administered medications for this visit. (Other)   REVIEW OF SYSTEMS: ROS    Positive for: Eyes Negative for: Constitutional, Gastrointestinal, Neurological, Skin, Genitourinary, Musculoskeletal, HENT, Endocrine, Cardiovascular, Respiratory, Psychiatric, Allergic/Imm, Heme/Lymph Last edited by Kingsley Spittle, COT on 09/10/2021  1:35 PM.      ALLERGIES Allergies  Allergen Reactions   Penicillins Swelling    "probably 1970; lips and tongue felt but didn't look swollen; dr told me not to take it again"   Tape Other (See Comments)    Tears skin   PAST MEDICAL HISTORY Past Medical History:  Diagnosis Date   Arthritis    Cancer [facility])    "female; not sure if uterus or endometrosis"   Cough    occasional   Glaucoma, right eye    lens implant done for 4 to  yrs ago   High cholesterol    03/23/11 'don't take anything for it"   Hypertension    Left bundle branch block    Stroke ([place]) 03/23/11   "mini strokes; years ago; didn't go to dr;  Rene Paci had babies since then"   Thyroid disease    S/P thyroidectomy   Tinnitus of both ears    Past Surgical History:  Procedure Laterality Date   ABDOMINAL HYSTERECTOMY  1970   APPENDECTOMY  ~ [place]  ~ [place]     cadaver bone  2011   right small finger; "took more than cyst out too"  CATARACT EXTRACTION W/ INTRAOCULAR LENS IMPLANT  ~ 2010   right   ENDOVENOUS ABLATION SAPHENOUS VEIN W/ LASER  04/14/2010   left; [facility]SV   EYE SURGERY     HIP PINNING,CANNULATED Right 02/05/2021   Procedure: CANNULATED HIP PINNING;  Surgeon: Armond Hang, MD;  Location: [facility];  Service: Orthopedics;  Laterality: Right;   INTRACAPSULAR CATARACT EXTRACTION  ~ 2010   left   Stab Phlebectomies   06/03/10   R leg; "for varicose veins"   THYROIDECTOMY  ~ 1970's   TONSILLECTOMY  1943   TOTAL HIP ARTHROPLASTY Left 04/06/2012   Procedure: TOTAL HIP ARTHROPLASTY;  Surgeon: Gearlean Alf, MD;  Location: [facility];  Service: Orthopedics;  Laterality: Left;   TUBAL LIGATION  yrs ago   FAMILY  HISTORY History reviewed. No pertinent family history.  SOCIAL HISTORY Social History   Tobacco Use   Smoking status: Former    Types: Cigarettes    Quit date: 01/05/1970    Years since quitting: 51.7   Smokeless tobacco: Never   Tobacco comments:    03/23/11 'never really had habit of smoking; I have smoked a few cigarettes"  Substance Use Topics   Alcohol use: No   Drug use: No       OPHTHALMIC EXAM:  Base Eye Exam     Visual Acuity (Snellen - Linear)       Right Left   Dist [place] 20/60 -2 20/60   Dist ph [place] 20/40 -2 20/25 -2         Tonometry (Tonopen, 1:44 PM)       Right Left   Pressure 14 13         Pupils       Dark Light Shape React APD   Right 3 2 Round Brisk None   Left 3 2 Round Brisk None         Visual Fields (Counting fingers)       Left Right    Full Full         Extraocular Movement       Right Left    Full, Ortho Full, Ortho         Neuro/Psych     Oriented x3: Yes   Mood/Affect: Normal         Dilation     Both eyes: 1.0% Mydriacyl, 2.5% Phenylephrine @ 1:45 PM           Slit Lamp and Fundus Exam     External Exam       Right Left   External Normal Normal         Slit Lamp Exam       Right Left   Lids/Lashes Dermatochalasis - upper lid, Meibomian gland dysfunction Dermatochalasis - upper lid, Meibomian gland dysfunction   Conjunctiva/Sclera White and quiet White and quiet   Cornea Arcus, Well healed temporal cataract wound 1+ fine Punctate epithelial erosions   Anterior Chamber Deep and clear, narrow temporal angle Deep and clear   Iris Round and dilated, patent [id number] Round and dilated, patent PI [id number]   Lens PC IOL toric in good position with marks at 1145 and 0545, trace Posterior capsular opacification PC IOL in good position   Anterior Vitreous Vitreous syneresis Vitreous syneresis         Fundus Exam       Right Left   Disc mild Pallor, Sharp rim Mild Pallor, Sharp rim, Compact   C/D Ratio  0.2 0.2  Macula Flat, Blunted foveal reflex, Drusen, RPE mottling and clumping, central fibrosis with mild pigment clumping, No heme or edema Flat, Blunted foveal reflex, Drusen, RPE mottling, clumping, and early atrophy   Vessels Attenuated, Tortuous Attenuated, Tortuous, focal corkscrew vessel off ST venule, severe peripheral attenuation superior quad - ?old BRVO superiorly   Periphery Attached, mild Reticular degeneration, subretinal exudation and pigmented CR scarring temporal midzone Attached, No heme           IMAGING AND PROCEDURES  Imaging and Procedures for 09/10/2021  OCT, Retina - OU - Both Eyes       Right Eye Quality was good. Central Foveal Thickness: 271. Progression has been stable. Findings include no IRF, no SRF, abnormal foveal contour, retinal drusen , pigment epithelial detachment, outer retinal atrophy (Central PED with overlying patchy ORA--no fluid--stable).   Left Eye Quality was good. Central Foveal Thickness: 275. Progression has been stable. Findings include normal foveal contour, no IRF, no SRF, retinal drusen , pigment epithelial detachment (Low lying PED-- no fluid).   Notes *Images captured and stored on drive  Diagnosis / Impression:  OD: Exu ARMD -- central low PED OS: Nonexudative ARMD *no IRF/SRF OU  Clinical management:  See below  Abbreviations: NFP - Normal foveal profile. CME - cystoid macular edema. PED - pigment epithelial detachment. IRF - intraretinal fluid. SRF - subretinal fluid. EZ - ellipsoid zone. ERM - epiretinal membrane. ORA - outer retinal atrophy. ORT - outer retinal tubulation. SRHM - subretinal hyper-reflective material. IRHM - intraretinal hyper-reflective material      Intravitreal Injection, Pharmacologic Agent - OD - Right Eye       Time Out 09/10/2021. 2:48 PM. Confirmed correct patient, procedure, site, and patient consented.   Anesthesia Topical anesthesia was used. Anesthetic medications included Lidocaine 2%,  Proparacaine 0.5%.   Procedure Preparation included 5% betadine to ocular surface, eyelid speculum. A (32g) needle was used.   Injection: 1.25 mg Bevacizumab 1.89m/0.05ml   Route: Intravitreal, Site: Right Eye   NDC: 50242-060-01, Lot:: [id number] Expiration date: 10/14/2021   Post-op Post injection exam found visual acuity of at least counting fingers. The patient tolerated the procedure well. There were no complications. The patient received written and verbal post procedure care education. Post injection medications were not given.            ASSESSMENT/PLAN:    ICD-10-CM   1. Exudative age-related macular degeneration of right eye with active choroidal neovascularization (HCC)  H35.3211 OCT, Retina - OU - Both Eyes    Intravitreal Injection, Pharmacologic Agent - OD - Right Eye    Bevacizumab (AVASTIN) SOLN 1.25 mg    2. Intermediate stage nonexudative age-related macular degeneration of right eye  H35.3112     3. Essential hypertension  I10     4. Hypertensive retinopathy of both eyes  H35.033     5. Pseudophakia  Z96.1       1. Exudative age related macular degeneration, OD  - s/p IVA OD #1 (06.15.23)  - [place]-- [place]-- history of q12wk IVA OD [facility]  - OCT shows OD Central PED with overlying patchy ORA--no fluid--stable  - FA transit OD (07.27.23) shows +CNV OD; OS w/ early and late staining.  - recommend IVA OD #2 today, 09.06.23 -- will continue q12 wk IVA OD - pt wishes to proceed with injection - RBA of procedure discussed, questions answered -  informed consent obtained and signed - see procedure note - Avastin informed consent obtained and signed for OD 06.15.23  - f/u in 12 wks -- DFE/OCT  2. Age related macular degeneration, non-exudative, OS  - The incidence, anatomy, and pathology of dry AMD, risk of progression, and the AREDS and AREDS 2 studies including smoking  risks discussed with patient.  - Recommend amsler grid monitoring  - monitoring    3,4. Hypertensive retinopathy OU - discussed importance of tight BP control - FA 07.27.23 shows telangectasias and tortuous vessels along ST arcades -- ?old superior BRVO OS - no macular edema - monitor   5. Pseudophakia OU  - s/p CE/IOL OU (Dr. Elliot Dally, unknown date)  - IOLs in good position, doing well  - monitor     Ophthalmic Meds Ordered this visit:  Meds ordered this encounter  Medications   Bevacizumab (AVASTIN) SOLN 1.25 mg     Return in about 12 weeks (around 12/03/2021) for f/u exu ARMD OD, DFE, OCT.  There are no Patient Instructions on file for this visit.  Explained the diagnoses, plan, and follow up with the patient and they expressed understanding.  Patient expressed understanding of the importance of proper follow up care.    This document serves as a record of services personally performed by Gardiner Sleeper, MD, PhD. It was created on their behalf by Orvan Falconer, an ophthalmic technician. The creation of this record is the provider's dictation and/or activities during the visit.    Electronically signed by: Orvan Falconer, OA, 09/10/21  8:52 PM  This document serves as a record of services personally performed by Gardiner Sleeper, MD, PhD. It was created on their behalf by San Jetty. Owens Shark, OA an ophthalmic technician. The creation of this record is the provider's dictation and/or activities during the visit.    Electronically signed by: San Jetty. Owens Shark, [place] 09.06.2023 8:52 PM   Gardiner Sleeper, M.D., Ph.D. Diseases & Surgery of the Retina and Vitreous [facility]  I have reviewed the above documentation for accuracy and completeness, and I agree with the above. Gardiner Sleeper, M.D., Ph.D. 09/10/21 8:52 PM   Abbreviations: M myopia (nearsighted); A astigmatism; H hyperopia (farsighted); P presbyopia; Mrx spectacle prescription;  CTL contact  lenses; OD right eye; OS left eye; OU both eyes  XT exotropia; ET esotropia; PEK punctate epithelial keratitis; PEE punctate epithelial erosions; DES dry eye syndrome; MGD meibomian gland dysfunction; ATs artificial tears; PFAT's preservative free artificial tears; [place] nuclear sclerotic cataract; PSC posterior subcapsular cataract; ERM epi-retinal membrane; PVD posterior vitreous detachment; RD retinal detachment; DM diabetes mellitus; DR diabetic retinopathy; NPDR non-proliferative diabetic retinopathy; PDR proliferative diabetic retinopathy; CSME clinically significant macular edema; DME diabetic macular edema; dbh dot blot hemorrhages; CWS cotton wool spot; POAG primary open angle glaucoma; C/D cup-to-disc ratio; HVF humphrey visual field; GVF goldmann visual field; OCT optical coherence tomography; IOP intraocular pressure; BRVO Branch retinal vein occlusion; CRVO central retinal vein occlusion; CRAO central retinal artery occlusion; BRAO branch retinal artery occlusion; RT retinal tear; SB scleral buckle; PPV pars plana vitrectomy; VH Vitreous hemorrhage; PRP panretinal laser photocoagulation; IVK intravitreal kenalog; VMT vitreomacular traction; MH Macular hole;  NVD neovascularization of the disc; NVE neovascularization elsewhere; AREDS age related eye disease study; ARMD age related macular degeneration; POAG primary open angle glaucoma; EBMD epithelial/anterior basement membrane dystrophy; ACIOL anterior chamber intraocular lens; IOL intraocular lens; PCIOL posterior chamber intraocular lens; Phaco/IOL phacoemulsification with intraocular lens placement;  [place] photorefractive keratectomy; LASIK laser assisted in situ keratomileusis; HTN hypertension; DM diabetes mellitus; COPD chronic obstructive pulmonary disease

## 2021-09-10 ENCOUNTER — Ambulatory Visit (INDEPENDENT_AMBULATORY_CARE_PROVIDER_SITE_OTHER): Payer: Medicare Other | Admitting: Ophthalmology

## 2021-09-10 ENCOUNTER — Encounter (INDEPENDENT_AMBULATORY_CARE_PROVIDER_SITE_OTHER): Payer: Self-pay | Admitting: Ophthalmology

## 2021-09-10 DIAGNOSIS — I10 Essential (primary) hypertension: Secondary | ICD-10-CM

## 2021-09-10 DIAGNOSIS — H353211 Exudative age-related macular degeneration, right eye, with active choroidal neovascularization: Secondary | ICD-10-CM | POA: Diagnosis not present

## 2021-09-10 DIAGNOSIS — H35033 Hypertensive retinopathy, bilateral: Secondary | ICD-10-CM | POA: Diagnosis not present

## 2021-09-10 DIAGNOSIS — H353112 Nonexudative age-related macular degeneration, right eye, intermediate dry stage: Secondary | ICD-10-CM | POA: Diagnosis not present

## 2021-09-10 DIAGNOSIS — Z961 Presence of intraocular lens: Secondary | ICD-10-CM

## 2021-09-10 MED ORDER — BEVACIZUMAB CHEMO INJECTION 1.25MG/0.05ML SYRINGE FOR KALEIDOSCOPE
1.2500 mg | INTRAVITREAL | Status: AC | PRN
  Administered 2021-09-10: 1.25 mg via INTRAVITREAL

## 2021-11-18 NOTE — Progress Notes (Signed)
[facility] Note  12/02/2021    CHIEF COMPLAINT Patient presents for Retina Follow Up   HISTORY OF PRESENT ILLNESS: Alyssa [name] is a 88 y.o. female who presents to the clinic today for:   HPI     Retina Follow Up   Patient presents with  Dry AMD.  In right eye.  Severity is moderate.  Duration of 12 weeks.  Since onset it is stable.  I, the attending physician,  performed the HPI with the patient and updated documentation appropriately.        Comments   Patient feels that the vision is the same. Since the last shot in the right eye she has felt like something is in the eye. She has to shut the eye due to the way it feels. She is using AT's OU PRN.       Last edited by Bernarda Caffey, MD on 12/02/2021 10:24 PM.     Pt states right eye feels like it has "trash" in it  Referring physician: Ulyses Southward, MD No address on file  HISTORICAL INFORMATION:   Selected notes from the [facility] Referred by Dr. Elliot Dally -- pt had cataract surgery with him in the past, not actually a current pt LEE:  Ocular Hx- PMH-    CURRENT MEDICATIONS: Current Outpatient Medications (Ophthalmic Drugs)  Medication Sig   Hypromellose 0.4 % SOLN Apply 1 drop to eye daily as needed (for dry eyes). (Patient not taking: Reported on 06/19/2021)   No current facility-administered medications for this visit. (Ophthalmic Drugs)   Current Outpatient Medications (Other)  Medication Sig   enoxaparin (LOVENOX) 40 MG/0.4ML injection Inject 0.4 mLs (40 mg total) into the skin daily for 19 days.   HYDROcodone-acetaminophen (NORCO/VICODIN) 5-325 MG tablet Take 1 tablet by mouth every 6 (six) hours as needed for moderate pain. (Patient not taking: Reported on 06/19/2021)   lisinopril (PRINIVIL,ZESTRIL) 10 MG tablet Take 5 mg by mouth as needed (for systolic blood pressure of greater than 160). (Patient not taking: Reported on 06/19/2021)   Multiple  Vitamins-Minerals (PRESERVISION AREDS 2 PO) Take 1 capsule by mouth 2 (two) times daily.   No current facility-administered medications for this visit. (Other)   REVIEW OF SYSTEMS: ROS   Positive for: Eyes Negative for: Constitutional, Gastrointestinal, Neurological, Skin, Genitourinary, Musculoskeletal, HENT, Endocrine, Cardiovascular, Respiratory, Psychiatric, Allergic/Imm, Heme/Lymph Last edited by Annie Paras, COT on 12/02/2021  1:11 PM.     ALLERGIES Allergies  Allergen Reactions   Penicillins Swelling    "probably 1970; lips and tongue felt but didn't look swollen; dr told me not to take it again"   Tape Other (See Comments)    Tears skin   PAST MEDICAL HISTORY Past Medical History:  Diagnosis Date   Arthritis    Cancer [facility])    "female; not sure if uterus or endometrosis"   Cough    occasional   Glaucoma, right eye    lens implant done for 4 to  yrs ago   High cholesterol    03/23/11 'don't take anything for it"   Hypertension    Left bundle branch block    Stroke ([place]) 03/23/11   "mini strokes; years ago; didn't go to dr;  Rene Paci had babies since then"   Thyroid disease    S/P thyroidectomy   Tinnitus of both ears    Past Surgical History:  Procedure Laterality Date   [place]  APPENDECTOMY  ~ [place]  ~ [place]     cadaver bone  2011   right small finger; "took more than cyst out too"   CATARACT EXTRACTION W/ INTRAOCULAR LENS IMPLANT  ~ 2010   right   ENDOVENOUS ABLATION SAPHENOUS VEIN W/ LASER  04/14/2010   left; [facility]   EYE SURGERY     HIP PINNING,CANNULATED Right 02/05/2021   Procedure: CANNULATED HIP PINNING;  Surgeon: Armond Hang, MD;  Location: [facility];  Service: Orthopedics;  Laterality: Right;   INTRACAPSULAR CATARACT EXTRACTION  ~ 2010   left   Stab Phlebectomies   06/03/10   R leg; "for varicose veins"   THYROIDECTOMY  ~ 1970's   TONSILLECTOMY  1943   TOTAL HIP ARTHROPLASTY  Left 04/06/2012   Procedure: TOTAL HIP ARTHROPLASTY;  Surgeon: Gearlean Alf, MD;  Location: [facility];  Service: Orthopedics;  Laterality: Left;   TUBAL LIGATION  yrs ago   FAMILY HISTORY History reviewed. No pertinent family history.  SOCIAL HISTORY Social History   Tobacco Use   Smoking status: Former    Types: Cigarettes    Quit date: 01/05/1970    Years since quitting: 51.9   Smokeless tobacco: Never   Tobacco comments:    03/23/11 'never really had habit of smoking; I have smoked a few cigarettes"  Substance Use Topics   Alcohol use: No   Drug use: No       OPHTHALMIC EXAM:  Base Eye Exam     Visual Acuity (Snellen - Linear)       Right Left   Dist [place] 20/70 20/60   Dist ph [place] 20/50 20/25         Tonometry (Tonopen, 1:16 PM)       Right Left   Pressure 14 14         Pupils       Dark Light Shape React APD   Right 3 2 Round Brisk None   Left 3 2 Round Brisk None         Visual Fields       Left Right    Full Full         Extraocular Movement       Right Left    Full, Ortho Full, Ortho         Neuro/Psych     Oriented x3: Yes   Mood/Affect: Normal         Dilation     Both eyes: 1.0% Mydriacyl, 2.5% Phenylephrine @ 1:11 PM           Slit Lamp and Fundus Exam     External Exam       Right Left   External Normal Normal         Slit Lamp Exam       Right Left   Lids/Lashes Dermatochalasis - upper lid, Meibomian gland dysfunction Dermatochalasis - upper lid, Meibomian gland dysfunction   Conjunctiva/Sclera White and quiet White and quiet   Cornea Arcus, Well healed temporal cataract wound 1+ fine Punctate epithelial erosions   Anterior Chamber Deep and clear, narrow temporal angle Deep and clear   Iris Round and dilated, patent [id number] Round and dilated, patent PI [id number]   Lens PC IOL toric in good position with marks at 1145 and 0545, trace Posterior capsular opacification PC IOL in good position   Anterior Vitreous  Vitreous syneresis Vitreous syneresis  Fundus Exam       Right Left   Disc mild Pallor, Sharp rim Mild Pallor, Sharp rim, Compact   C/D Ratio 0.2 0.2   Macula Flat, Blunted foveal reflex, Drusen, RPE mottling and clumping, central fibrosis with mild pigment clumping, No heme or edema Flat, Blunted foveal reflex, Drusen, RPE mottling, clumping, and early atrophy   Vessels Attenuated, Tortuous Attenuated, Tortuous, focal corkscrew vessel off ST venule, severe peripheral attenuation superior quad - ?old BRVO superiorly   Periphery Attached, mild Reticular degeneration, subretinal exudation and pigmented CR scarring temporal midzone Attached, No heme           IMAGING AND PROCEDURES  Imaging and Procedures for 12/02/2021  OCT, Retina - OU - Both Eyes       Right Eye Quality was good. Central Foveal Thickness: 283. Progression has been stable. Findings include no IRF, no SRF, abnormal foveal contour, retinal drusen , pigment epithelial detachment, outer retinal atrophy (Central PED with overlying patchy ORA -- no fluid -- stable).   Left Eye Quality was good. Central Foveal Thickness: 264. Progression has been stable. Findings include normal foveal contour, no IRF, no SRF, retinal drusen , pigment epithelial detachment (Low lying PED -- no fluid).   Notes *Images captured and stored on drive  Diagnosis / Impression:  OD: Exu ARMD -- central low PED OS: Nonexudative ARMD *no IRF/SRF OU  Clinical management:  See below  Abbreviations: NFP - Normal foveal profile. CME - cystoid macular edema. PED - pigment epithelial detachment. IRF - intraretinal fluid. SRF - subretinal fluid. EZ - ellipsoid zone. ERM - epiretinal membrane. ORA - outer retinal atrophy. ORT - outer retinal tubulation. SRHM - subretinal hyper-reflective material. IRHM - intraretinal hyper-reflective material      Intravitreal Injection, Pharmacologic Agent - OD - Right Eye       Time Out 12/02/2021.  2:21 PM. Confirmed correct patient, procedure, site, and patient consented.   Anesthesia Topical anesthesia was used. Anesthetic medications included Lidocaine 2%, Proparacaine 0.5%.   Procedure Preparation included 5% betadine to ocular surface, eyelid speculum. A supplied (32g) needle was used.   Injection: 1.25 mg Bevacizumab 1.'25mg'$ /0.27m   Route: Intravitreal, Site: Right Eye   NDC: [id number] Lot: [id number]'$ , Expiration date: 01/21/2022   Post-op Post injection exam found visual acuity of at least counting fingers. The patient tolerated the procedure well. There were no complications. The patient received written and verbal post procedure care education. Post injection medications were not given.            ASSESSMENT/PLAN:    ICD-10-CM   1. Exudative age-related macular degeneration of right eye with active choroidal neovascularization (HCC)  H35.3211 OCT, Retina - OU - Both Eyes    Intravitreal Injection, Pharmacologic Agent - OD - Right Eye    Bevacizumab (AVASTIN) SOLN 1.25 mg    2. Intermediate stage nonexudative age-related macular degeneration of right eye  H35.3112     3. Essential hypertension  I10     4. Hypertensive retinopathy of both eyes  H35.033     5. Pseudophakia  Z96.1        1. Exudative age related macular degeneration, OD  - s/p IVA OD #1 (06.15.23), #2 (09.06.23)  - [id number]-- [place] of [place]12wk IVA OD -- [place]  - OCT shows OD Central PED with overlying patchy ORA -- no fluid -- stable  -  FA transit OD (07.27.23) shows +CNV OD; OS w/ early and late staining.  - recommend IVA OD #3 today, 11.28.23 -- will continue q12 wk IVA OD - pt wishes to proceed with injection - RBA of procedure discussed, questions answered - informed consent obtained and signed - see procedure note - Avastin informed consent obtained and signed for OD 06.15.23  - f/u in 12 wks  -- DFE/OCT  2. Age related macular degeneration, non-exudative, OS  - The incidence, anatomy, and pathology of dry AMD, risk of progression, and the AREDS and AREDS 2 studies including smoking risks discussed with patient.  - Recommend amsler grid monitoring  - monitoring    3,4. Hypertensive retinopathy OU - discussed importance of tight BP control - FA 07.27.23 shows telangectasias and tortuous vessels along ST arcades -- ?old superior BRVO OS - no macular edema - monitor   5. Pseudophakia OU  - s/p CE/IOL OU (Dr. Elliot Dally, unknown date)  - IOLs in good position, doing well  - monitor     Ophthalmic Meds Ordered this visit:  Meds ordered this encounter  Medications   Bevacizumab (AVASTIN) SOLN 1.25 mg     Return in about 12 weeks (around 02/24/2022) for f/u exu ARMD OD, DFE, OCT.  There are no Patient Instructions on file for this visit.  Explained the diagnoses, plan, and follow up with the patient and they expressed understanding.  Patient expressed understanding of the importance of proper follow up care.    This document serves as a record of services personally performed by Gardiner Sleeper, MD, PhD. It was created on their behalf by Renaldo Reel, [place] an ophthalmic technician. The creation of this record is the provider's dictation and/or activities during the visit.    Electronically signed by:  Renaldo Reel, COT  11.14.23 10:24 PM  This document serves as a record of services personally performed by Gardiner Sleeper, MD, PhD. It was created on their behalf by San Jetty. Owens Shark, OA an ophthalmic technician. The creation of this record is the provider's dictation and/or activities during the visit.    Electronically signed by: San Jetty. Owens Shark, [place] 11.28.2023 10:24 PM   Gardiner Sleeper, M.D., Ph.D. Diseases & Surgery of the Retina and Vitreous [facility]  I have reviewed the above documentation for accuracy and completeness, and I agree  with the above. Gardiner Sleeper, M.D., Ph.D. 12/02/21 10:26 PM   Abbreviations: M myopia (nearsighted); A astigmatism; H hyperopia (farsighted); P presbyopia; Mrx spectacle prescription;  CTL contact lenses; OD right eye; OS left eye; OU both eyes  XT exotropia; ET esotropia; PEK punctate epithelial keratitis; PEE punctate epithelial erosions; DES dry eye syndrome; MGD meibomian gland dysfunction; ATs artificial tears; PFAT's preservative free artificial tears; [place] nuclear sclerotic cataract; PSC posterior subcapsular cataract; ERM epi-retinal membrane; PVD posterior vitreous detachment; RD retinal detachment; DM diabetes mellitus; DR diabetic retinopathy; NPDR non-proliferative diabetic retinopathy; PDR proliferative diabetic retinopathy; CSME clinically significant macular edema; DME diabetic macular edema; dbh dot blot hemorrhages; CWS cotton wool spot; POAG primary open angle glaucoma; C/D cup-to-disc ratio; HVF humphrey visual field; GVF goldmann visual field; OCT optical coherence tomography; IOP intraocular pressure; BRVO Branch retinal vein occlusion; CRVO central retinal vein occlusion; CRAO central retinal artery occlusion; BRAO branch retinal artery occlusion; RT retinal tear; SB scleral buckle; PPV pars plana vitrectomy; VH Vitreous hemorrhage; PRP panretinal laser photocoagulation; IVK intravitreal kenalog; VMT vitreomacular traction; MH Macular hole;  NVD neovascularization of  the disc; NVE neovascularization elsewhere; AREDS age related eye disease study; ARMD age related macular degeneration; POAG primary open angle glaucoma; EBMD epithelial/anterior basement membrane dystrophy; ACIOL anterior chamber intraocular lens; IOL intraocular lens; PCIOL posterior chamber intraocular lens; Phaco/IOL phacoemulsification with intraocular lens placement; [place] photorefractive keratectomy; LASIK laser assisted in situ keratomileusis; HTN hypertension; DM diabetes mellitus; COPD chronic obstructive pulmonary  disease

## 2021-12-02 ENCOUNTER — Ambulatory Visit (INDEPENDENT_AMBULATORY_CARE_PROVIDER_SITE_OTHER): Payer: Medicare Other | Admitting: Ophthalmology

## 2021-12-02 ENCOUNTER — Encounter (INDEPENDENT_AMBULATORY_CARE_PROVIDER_SITE_OTHER): Payer: Self-pay | Admitting: Ophthalmology

## 2021-12-02 DIAGNOSIS — I10 Essential (primary) hypertension: Secondary | ICD-10-CM

## 2021-12-02 DIAGNOSIS — H353211 Exudative age-related macular degeneration, right eye, with active choroidal neovascularization: Secondary | ICD-10-CM

## 2021-12-02 DIAGNOSIS — H35033 Hypertensive retinopathy, bilateral: Secondary | ICD-10-CM | POA: Diagnosis not present

## 2021-12-02 DIAGNOSIS — Z961 Presence of intraocular lens: Secondary | ICD-10-CM

## 2021-12-02 DIAGNOSIS — H353112 Nonexudative age-related macular degeneration, right eye, intermediate dry stage: Secondary | ICD-10-CM

## 2021-12-02 MED ORDER — BEVACIZUMAB CHEMO INJECTION 1.25MG/0.05ML SYRINGE FOR KALEIDOSCOPE
1.2500 mg | INTRAVITREAL | Status: AC | PRN
  Administered 2021-12-02: 1.25 mg via INTRAVITREAL

## 2022-02-10 NOTE — Progress Notes (Signed)
[facility] Note  02/24/2022    CHIEF COMPLAINT Patient presents for Retina Follow Up   HISTORY OF PRESENT ILLNESS: Alyssa [name] is a 89 y.o. female who presents to the clinic today for:   HPI     Retina Follow Up   Patient presents with  Wet AMD.  In both eyes.  This started 12 weeks ago.  Duration of 12 weeks.  Since onset it is stable.  I, the attending physician,  performed the HPI with the patient and updated documentation appropriately.        Comments   12 week retina follow up ARMD OD and IVA OD pt states no vision changes noticed she denies any flashes or floaters       Last edited by Bernarda Caffey, MD on 02/24/2022  4:09 PM.    Pt states no vision changes.   Referring physician: Ulyses Southward, MD No address on file  HISTORICAL INFORMATION:   Selected notes from the MEDICAL RECORD NUMBER Referred by Dr. Elliot Dally -- pt had cataract surgery with him in the past, not actually a current pt LEE:  Ocular Hx- PMH-    CURRENT MEDICATIONS: Current Outpatient Medications (Ophthalmic Drugs)  Medication Sig   Hypromellose 0.4 % SOLN Apply 1 drop to eye daily as needed (for dry eyes). (Patient not taking: Reported on 06/19/2021)   No current facility-administered medications for this visit. (Ophthalmic Drugs)   Current Outpatient Medications (Other)  Medication Sig   enoxaparin (LOVENOX) 40 MG/0.4ML injection Inject 0.4 mLs (40 mg total) into the skin daily for 19 days.   HYDROcodone-acetaminophen (NORCO/VICODIN) 5-325 MG tablet Take 1 tablet by mouth every 6 (six) hours as needed for moderate pain. (Patient not taking: Reported on 06/19/2021)   lisinopril (PRINIVIL,ZESTRIL) 10 MG tablet Take 5 mg by mouth as needed (for systolic blood pressure of greater than 160). (Patient not taking: Reported on 06/19/2021)   Multiple Vitamins-Minerals (PRESERVISION AREDS 2 PO) Take 1 capsule by mouth 2 (two) times daily.   No current  facility-administered medications for this visit. (Other)   REVIEW OF SYSTEMS: ROS   Positive for: Eyes Negative for: Constitutional, Gastrointestinal, Neurological, Skin, Genitourinary, Musculoskeletal, HENT, Endocrine, Cardiovascular, Respiratory, Psychiatric, Allergic/Imm, Heme/Lymph Last edited by Parthenia Ames, COT on 02/24/2022  1:22 PM.      ALLERGIES Allergies  Allergen Reactions   Penicillins Swelling    "probably 1970; lips and tongue felt but didn't look swollen; dr told me not to take it again"   Tape Other (See Comments)    Tears skin   PAST MEDICAL HISTORY Past Medical History:  Diagnosis Date   Arthritis    Cancer [facility])    "female; not sure if uterus or endometrosis"   Cough    occasional   Glaucoma, right eye    lens implant done for 4 to  yrs ago   High cholesterol    03/23/11 'don't take anything for it"   Hypertension    Left bundle branch block    Stroke ([place]) 03/23/11   "mini strokes; years ago; didn't go to dr;  Rene Paci had babies since then"   Thyroid disease    S/P thyroidectomy   Tinnitus of both ears    Past Surgical History:  Procedure Laterality Date   ABDOMINAL HYSTERECTOMY  1970   APPENDECTOMY  ~ [place]  ~ [place]     cadaver bone  2011   right small finger; "took more than cyst out too"   CATARACT EXTRACTION W/ INTRAOCULAR LENS IMPLANT  ~ 2010   right   ENDOVENOUS ABLATION SAPHENOUS VEIN W/ LASER  04/14/2010   left; [facility]   EYE SURGERY     HIP PINNING,CANNULATED Right 02/05/2021   Procedure: CANNULATED HIP PINNING;  Surgeon: Armond Hang, MD;  Location: [facility];  Service: Orthopedics;  Laterality: Right;   INTRACAPSULAR CATARACT EXTRACTION  ~ 2010   left   Stab Phlebectomies   06/03/10   R leg; "for varicose veins"   THYROIDECTOMY  ~ 1970's   TONSILLECTOMY  1943   TOTAL HIP ARTHROPLASTY Left 04/06/2012   Procedure: TOTAL HIP ARTHROPLASTY;  Surgeon: Gearlean Alf, MD;  Location: [facility];   Service: Orthopedics;  Laterality: Left;   TUBAL LIGATION  yrs ago   FAMILY HISTORY History reviewed. No pertinent family history.  SOCIAL HISTORY Social History   Tobacco Use   Smoking status: Former    Types: Cigarettes    Quit date: 01/05/1970    Years since quitting: 52.1   Smokeless tobacco: Never   Tobacco comments:    03/23/11 'never really had habit of smoking; I have smoked a few cigarettes"  Substance Use Topics   Alcohol use: No   Drug use: No       OPHTHALMIC EXAM:  Base Eye Exam     Visual Acuity (Snellen - Linear)       Right Left   Dist [place] 20/70 20/60   Dist ph [place] 20/50 -2 20/40 -1         Tonometry (Tonopen, 1:29 PM)       Right Left   Pressure 15 16         Pupils       Pupils Dark Light Shape React APD   Right PERRL 3 2 Round Brisk None   Left PERRL 3 2 Round Brisk None         Visual Fields       Left Right    Full Full         Extraocular Movement       Right Left    Full, Ortho Full, Ortho         Neuro/Psych     Oriented x3: Yes   Mood/Affect: Normal         Dilation     Both eyes: 2.5% Phenylephrine @ 1:29 PM           Slit Lamp and Fundus Exam     External Exam       Right Left   External Normal Normal         Slit Lamp Exam       Right Left   Lids/Lashes Dermatochalasis - upper lid, Meibomian gland dysfunction Dermatochalasis - upper lid, Meibomian gland dysfunction   Conjunctiva/Sclera White and quiet White and quiet   Cornea Arcus, Well healed temporal cataract wound 1+ fine Punctate epithelial erosions   Anterior Chamber Deep and clear, narrow temporal angle Deep and clear   Iris Round and dilated, patent [id number] Round and dilated, patent PI [id number]   Lens PC IOL toric in good position with marks at 1145 and 0545, trace Posterior capsular opacification PC IOL in good position   Anterior Vitreous Vitreous syneresis Vitreous syneresis         Fundus Exam       Right Left   Disc mild  Pallor, Hervey Ard  rim Mild Pallor, Sharp rim, Compact   C/D Ratio 0.2 0.2   Macula Flat, Blunted foveal reflex, Drusen, RPE mottling and clumping, central fibrosis with mild pigment clumping, No heme or edema Flat, Blunted foveal reflex, Drusen, RPE mottling, clumping, and early atrophy   Vessels Attenuated, Tortuous Attenuated, Tortuous, focal corkscrew vessel off ST venule, severe peripheral attenuation superior quad - ?old BRVO superiorly   Periphery Attached, mild Reticular degeneration, subretinal exudation and pigmented CR scarring temporal midzone Attached, No heme           IMAGING AND PROCEDURES  Imaging and Procedures for 02/24/2022  OCT, Retina - OU - Both Eyes       Right Eye Quality was good. Central Foveal Thickness: 275. Progression has been stable. Findings include no IRF, no SRF, abnormal foveal contour, retinal drusen , pigment epithelial detachment, outer retinal atrophy (Central PED with overlying patchy ORA -- no fluid -- stable).   Left Eye Quality was good. Central Foveal Thickness: 261. Progression has been stable. Findings include normal foveal contour, no IRF, no SRF, retinal drusen , pigment epithelial detachment (Low lying PED -- no fluid).   Notes *Images captured and stored on drive  Diagnosis / Impression:  OD: Exu ARMD -- central low PED--no fluid, stable OS: Nonexudative ARMD *no IRF/SRF OU  Clinical management:  See below  Abbreviations: NFP - Normal foveal profile. CME - cystoid macular edema. PED - pigment epithelial detachment. IRF - intraretinal fluid. SRF - subretinal fluid. EZ - ellipsoid zone. ERM - epiretinal membrane. ORA - outer retinal atrophy. ORT - outer retinal tubulation. SRHM - subretinal hyper-reflective material. IRHM - intraretinal hyper-reflective material      Intravitreal Injection, Pharmacologic Agent - OD - Right Eye       Time Out 02/24/2022. 2:22 PM. Confirmed correct patient, procedure, site, and patient consented.    Anesthesia Topical anesthesia was used. Anesthetic medications included Lidocaine 2%, Proparacaine 0.5%.   Procedure Preparation included 5% betadine to ocular surface, eyelid speculum. A supplied needle was used.   Injection: 1.25 mg Bevacizumab 1.48m/0.05ml   Route: Intravitreal, Site: Right Eye   NDC: 50242-060-01, Lot:[id number] Expiration date: 04/19/2022   Post-op Post injection exam found visual acuity of at least counting fingers. The patient tolerated the procedure well. There were no complications. The patient received written and verbal post procedure care education. Post injection medications were not given.             ASSESSMENT/PLAN:    ICD-10-CM   1. Exudative age-related macular degeneration of right eye with active choroidal neovascularization (HCC)  H35.3211 OCT, Retina - OU - Both Eyes    Intravitreal Injection, Pharmacologic Agent - OD - Right Eye    Bevacizumab (AVASTIN) SOLN 1.25 mg    2. Intermediate stage nonexudative age-related macular degeneration of right eye  H35.3112     3. Essential hypertension  I10     4. Hypertensive retinopathy of both eyes  H35.033     5. Pseudophakia  Z96.1      1. Exudative age related macular degeneration, OD  - s/p IVA OD #1 (06.15.23), #2 (09.06.23), #3 (11.28.23)  - former pt of Dr. BMarlon Pelat [place]udolph-- [place] in [place]-- history of q12wk IVA OD -- [facility]  - FA transit OD (07.27.23) shows +CNV OD; OS w/ early and late staining.   - OCT shows OD Central PED with overlying patchy ORA -- no fluid --  stable at 12 wks - recommend IVA OD #4 today, 02.20.24 -- maintenance w/ ext f/u to 14 wks - pt wishes to proceed with injection - RBA of procedure discussed, questions answered - informed consent obtained and signed - see procedure note - Avastin informed consent obtained and signed for OD 06.15.23  - f/u in 14 wks -- DFE/OCT, possible injxn  2. Age related macular  degeneration, non-exudative, OS  - The incidence, anatomy, and pathology of dry AMD, risk of progression, and the AREDS and AREDS 2 studies including smoking risks discussed with patient.  - Recommend amsler grid monitoring  - monitoring    3,4. Hypertensive retinopathy OU - discussed importance of tight BP control - FA 07.27.23 shows telangectasias and tortuous vessels along ST arcades -- ?old superior BRVO OS - no macular edema - monitor   5. Pseudophakia OU  - s/p CE/IOL OU (Dr. Elliot Dally, unknown date)  - IOLs in good position, doing well  - monitor     Ophthalmic Meds Ordered this visit:  Meds ordered this encounter  Medications   Bevacizumab (AVASTIN) SOLN 1.25 mg     Return in about 14 weeks (around 06/02/2022) for ex ARMD OD, Dilated Exam, OCT, Possible Injxn.  There are no Patient Instructions on file for this visit.  Explained the diagnoses, plan, and follow up with the patient and they expressed understanding.  Patient expressed understanding of the importance of proper follow up care.    This document serves as a record of services personally performed by Gardiner Sleeper, MD, PhD. It was created on their behalf by Renaldo Reel, [place] an ophthalmic technician. The creation of this record is the provider's dictation and/or activities during the visit.    Electronically signed by:  Renaldo Reel, COT  02.06.24 9:14 PM  This document serves as a record of services personally performed by Gardiner Sleeper, MD, PhD. It was created on their behalf by Roselee Nova, COMT. The creation of this record is the provider's dictation and/or activities during the visit.  Electronically signed by: Roselee Nova, COMT 02/25/22 9:14 PM  Gardiner Sleeper, M.D., Ph.D. Diseases & Surgery of the Retina and Vitreous [facility]  I have reviewed the above documentation for accuracy and completeness, and I agree with the above. Gardiner Sleeper, M.D., Ph.D.  02/25/22 9:17 PM   Abbreviations: M myopia (nearsighted); A astigmatism; H hyperopia (farsighted); P presbyopia; Mrx spectacle prescription;  CTL contact lenses; OD right eye; OS left eye; OU both eyes  XT exotropia; ET esotropia; PEK punctate epithelial keratitis; PEE punctate epithelial erosions; DES dry eye syndrome; MGD meibomian gland dysfunction; ATs artificial tears; PFAT's preservative free artificial tears; [place] nuclear sclerotic cataract; PSC posterior subcapsular cataract; ERM epi-retinal membrane; PVD posterior vitreous detachment; RD retinal detachment; DM diabetes mellitus; DR diabetic retinopathy; NPDR non-proliferative diabetic retinopathy; PDR proliferative diabetic retinopathy; CSME clinically significant macular edema; DME diabetic macular edema; dbh dot blot hemorrhages; CWS cotton wool spot; POAG primary open angle glaucoma; C/D cup-to-disc ratio; HVF humphrey visual field; GVF goldmann visual field; OCT optical coherence tomography; IOP intraocular pressure; BRVO Branch retinal vein occlusion; CRVO central retinal vein occlusion; CRAO central retinal artery occlusion; BRAO branch retinal artery occlusion; RT retinal tear; SB scleral buckle; PPV pars plana vitrectomy; VH Vitreous hemorrhage; PRP panretinal laser photocoagulation; IVK intravitreal kenalog; VMT vitreomacular traction; MH Macular hole;  NVD neovascularization of the disc; NVE neovascularization elsewhere; AREDS age related eye disease study; ARMD age  related macular degeneration; POAG primary open angle glaucoma; EBMD epithelial/anterior basement membrane dystrophy; ACIOL anterior chamber intraocular lens; IOL intraocular lens; PCIOL posterior chamber intraocular lens; Phaco/IOL phacoemulsification with intraocular lens placement; [place] photorefractive keratectomy; LASIK laser assisted in situ keratomileusis; HTN hypertension; DM diabetes mellitus; COPD chronic obstructive pulmonary disease

## 2022-02-24 ENCOUNTER — Ambulatory Visit (INDEPENDENT_AMBULATORY_CARE_PROVIDER_SITE_OTHER): Payer: Medicare Other | Admitting: Ophthalmology

## 2022-02-24 ENCOUNTER — Encounter (INDEPENDENT_AMBULATORY_CARE_PROVIDER_SITE_OTHER): Payer: Self-pay | Admitting: Ophthalmology

## 2022-02-24 DIAGNOSIS — H35033 Hypertensive retinopathy, bilateral: Secondary | ICD-10-CM

## 2022-02-24 DIAGNOSIS — H353112 Nonexudative age-related macular degeneration, right eye, intermediate dry stage: Secondary | ICD-10-CM | POA: Diagnosis not present

## 2022-02-24 DIAGNOSIS — I10 Essential (primary) hypertension: Secondary | ICD-10-CM

## 2022-02-24 DIAGNOSIS — H353211 Exudative age-related macular degeneration, right eye, with active choroidal neovascularization: Secondary | ICD-10-CM | POA: Diagnosis not present

## 2022-02-24 DIAGNOSIS — Z961 Presence of intraocular lens: Secondary | ICD-10-CM

## 2022-02-24 MED ORDER — BEVACIZUMAB CHEMO INJECTION 1.25MG/0.05ML SYRINGE FOR KALEIDOSCOPE
1.2500 mg | INTRAVITREAL | Status: AC | PRN
  Administered 2022-02-24: 1.25 mg via INTRAVITREAL

## 2022-05-25 NOTE — Progress Notes (Signed)
[facility] - Clinic Note  06/02/2022    CHIEF COMPLAINT Patient presents for Retina Follow Up   HISTORY OF PRESENT ILLNESS: Alyssa [name] is a 89 y.o. female who presents to the clinic today for:   HPI     Retina Follow Up   Patient presents with  Wet AMD.  In right eye.  This started 14 weeks ago.  I, the attending physician,  performed the HPI with the patient and updated documentation appropriately.        Comments   Patient here for 14 weeks retina follow up for exu ARMD OD. Patient states vision doing fine. Can thread a needle without glasses. Uses glasses to see far off somewhere.      Last edited by Rennis Chris, MD on 06/02/2022  4:12 PM.    Pt states she is able to thread a needle and sew without her glasses   Referring physician: Margie Billet, MD No address on file  HISTORICAL INFORMATION:   Selected notes from the MEDICAL RECORD NUMBER Referred by Dr. Hortense Ramal -- pt had cataract surgery with him in the past, not actually a current pt LEE:  Ocular Hx- PMH-    CURRENT MEDICATIONS: Current Outpatient Medications (Ophthalmic Drugs)  Medication Sig   Hypromellose 0.4 % SOLN Apply 1 drop to eye daily as needed (for dry eyes). (Patient not taking: Reported on 06/19/2021)   No current facility-administered medications for this visit. (Ophthalmic Drugs)   Current Outpatient Medications (Other)  Medication Sig   enoxaparin (LOVENOX) 40 MG/0.4ML injection Inject 0.4 mLs (40 mg total) into the skin daily for 19 days.   HYDROcodone-acetaminophen (NORCO/VICODIN) 5-325 MG tablet Take 1 tablet by mouth every 6 (six) hours as needed for moderate pain. (Patient not taking: Reported on 06/19/2021)   lisinopril (PRINIVIL,ZESTRIL) 10 MG tablet Take 5 mg by mouth as needed (for systolic blood pressure of greater than 160). (Patient not taking: Reported on 06/19/2021)   Multiple Vitamins-Minerals (PRESERVISION AREDS 2 PO) Take 1 capsule by mouth 2  (two) times daily. (Patient not taking: Reported on 06/02/2022)   No current facility-administered medications for this visit. (Other)   REVIEW OF SYSTEMS: ROS   Positive for: Eyes Negative for: Constitutional, Gastrointestinal, Neurological, Skin, Genitourinary, Musculoskeletal, HENT, Endocrine, Cardiovascular, Respiratory, Psychiatric, Allergic/Imm, Heme/Lymph Last edited by Laddie Aquas, COA on 06/02/2022  1:14 PM.       ALLERGIES Allergies  Allergen Reactions   Penicillins Swelling    "probably 1970; lips and tongue felt but didn't look swollen; dr told me not to take it again"   Tape Other (See Comments)    Tears skin   PAST MEDICAL HISTORY Past Medical History:  Diagnosis Date   Arthritis    Cancer [facility])    "female; not sure if uterus or endometrosis"   Cough    occasional   Glaucoma, right eye    lens implant done for 4 to  yrs ago   High cholesterol    03/23/11 'don't take anything for it"   Hypertension    Left bundle branch block    Stroke (HCC) 03/23/11   "mini strokes; years ago; didn't go to dr;  Peggye Form had babies since then"   Thyroid disease    S/P thyroidectomy   Tinnitus of both ears    Past Surgical History:  Procedure Laterality Date   ABDOMINAL HYSTERECTOMY  1970   APPENDECTOMY  ~ 1947   BREAST ENHANCEMENT SURGERY  ~  1980   BREAST SURGERY     cadaver bone  2011   right small finger; "took more than cyst out too"   CATARACT EXTRACTION W/ INTRAOCULAR LENS IMPLANT  ~ 2010   right   ENDOVENOUS ABLATION SAPHENOUS VEIN W/ LASER  04/14/2010   left; [facility]   EYE SURGERY     HIP PINNING,CANNULATED Right 02/05/2021   Procedure: CANNULATED HIP PINNING;  Surgeon: Netta Cedars, MD;  Location: [facility];  Service: Orthopedics;  Laterality: Right;   INTRACAPSULAR CATARACT EXTRACTION  ~ 2010   left   Stab Phlebectomies   06/03/10   R leg; "for varicose veins"   THYROIDECTOMY  ~ 1970's   TONSILLECTOMY  1943   TOTAL HIP ARTHROPLASTY Left 04/06/2012    Procedure: TOTAL HIP ARTHROPLASTY;  Surgeon: Loanne Drilling, MD;  Location: [facility];  Service: Orthopedics;  Laterality: Left;   TUBAL LIGATION  yrs ago   FAMILY HISTORY History reviewed. No pertinent family history.  SOCIAL HISTORY Social History   Tobacco Use   Smoking status: Former    Types: Cigarettes    Quit date: 01/05/1970    Years since quitting: 52.4   Smokeless tobacco: Never   Tobacco comments:    03/23/11 'never really had habit of smoking; I have smoked a few cigarettes"  Vaping Use   Vaping Use: Never used  Substance Use Topics   Alcohol use: No   Drug use: No       OPHTHALMIC EXAM:  Base Eye Exam     Visual Acuity (Snellen - Linear)       Right Left   Dist [place] 20/60 -2 20/60   Dist ph [place] 20/40 20/30 +1         Tonometry (Tonopen, 1:11 PM)       Right Left   Pressure 19 18         Pupils       Dark Light Shape React APD   Right 3 2 Round Brisk None   Left 3 2 Round Brisk None         Visual Fields (Counting fingers)       Left Right    Full Full         Extraocular Movement       Right Left    Full, Ortho Full, Ortho         Neuro/Psych     Oriented x3: Yes   Mood/Affect: Normal         Dilation     Both eyes: 1.0% Mydriacyl, 2.5% Phenylephrine @ 1:11 PM           Slit Lamp and Fundus Exam     External Exam       Right Left   External Normal Normal         Slit Lamp Exam       Right Left   Lids/Lashes Dermatochalasis - upper lid, Meibomian gland dysfunction Dermatochalasis - upper lid, Meibomian gland dysfunction   Conjunctiva/Sclera White and quiet White and quiet   Cornea Arcus, Well healed temporal cataract wound 1+ fine Punctate epithelial erosions   Anterior Chamber Deep and clear, narrow temporal angle Deep and clear   Iris Round and dilated, patent [id number] Round and dilated, patent PI [id number]   Lens PC IOL toric in good position with marks at 1145 and 0545, trace Posterior capsular opacification PC  IOL in good position   Anterior Vitreous Vitreous syneresis Vitreous syneresis  Fundus Exam       Right Left   Disc mild Pallor, Sharp rim Mild Pallor, Sharp rim, Compact   C/D Ratio 0.2 0.2   Macula Flat, Blunted foveal reflex, Drusen, RPE mottling and clumping, central atrophy and fibrosis with mild pigment clumping, No heme or edema Flat, Blunted foveal reflex, Drusen, RPE mottling, clumping, and early atrophy, No heme or edema   Vessels Attenuated, Tortuous Attenuated, Tortuous, focal corkscrew vessel off ST venule, severe peripheral attenuation superior quad - ?old BRVO superiorly   Periphery Attached, mild Reticular degeneration, subretinal exudation and pigmented CR scarring temporal midzone Attached, No heme           IMAGING AND PROCEDURES  Imaging and Procedures for 06/02/2022  OCT, Retina - OU - Both Eyes       Right Eye Quality was good. Central Foveal Thickness: 262. Progression has been stable. Findings include no IRF, no SRF, abnormal foveal contour, retinal drusen , pigment epithelial detachment, outer retinal atrophy (Central PED with overlying patchy ORA -- no fluid -- stable).   Left Eye Quality was good. Central Foveal Thickness: 294. Progression has been stable. Findings include normal foveal contour, no IRF, no SRF, retinal drusen , pigment epithelial detachment (Low lying PED -- no fluid).   Notes *Images captured and stored on drive  Diagnosis / Impression:  OD: Exu ARMD -- central low PED--no fluid, stable OS: Nonexudative ARMD *no IRF/SRF OU  Clinical management:  See below  Abbreviations: NFP - Normal foveal profile. CME - cystoid macular edema. PED - pigment epithelial detachment. IRF - intraretinal fluid. SRF - subretinal fluid. EZ - ellipsoid zone. ERM - epiretinal membrane. ORA - outer retinal atrophy. ORT - outer retinal tubulation. SRHM - subretinal hyper-reflective material. IRHM - intraretinal hyper-reflective material       Intravitreal Injection, Pharmacologic Agent - OD - Right Eye       Time Out 06/02/2022. 1:38 PM. Confirmed correct patient, procedure, site, and patient consented.   Anesthesia Topical anesthesia was used. Anesthetic medications included Lidocaine 2%, Proparacaine 0.5%.   Procedure Preparation included 5% betadine to ocular surface, eyelid speculum. A (32g) needle was used.   Injection: 1.25 mg Bevacizumab 1.25mg /0.81ml   Route: Intravitreal, Site: Right Eye   NDC: [id number], Lot: [id number], Expiration date: 09/05/2022   Post-op Post injection exam found visual acuity of at least counting fingers. The patient tolerated the procedure well. There were no complications. The patient received written and verbal post procedure care education. Post injection medications were not given.              ASSESSMENT/PLAN:    ICD-10-CM   1. Exudative age-related macular degeneration of right eye with active choroidal neovascularization (HCC)  H35.3211 OCT, Retina - OU - Both Eyes    Intravitreal Injection, Pharmacologic Agent - OD - Right Eye    Bevacizumab (AVASTIN) SOLN 1.25 mg    2. Intermediate stage nonexudative age-related macular degeneration of right eye  H35.3112     3. Essential hypertension  I10     4. Hypertensive retinopathy of both eyes  H35.033     5. Pseudophakia  Z96.1      1. Exudative age related macular degeneration, OD  - s/p IVA OD #1 (06.15.23), #2 (09.06.23), #3 (11.28.23), #4 (02.20.24) - former pt of Dr. Dahlia Client [facility] [place] -- pt lives in [place] -- history of q12wk IVA OD -- last injection at [facility] was IVA OD 3.2.23  - FA transit OD (07.27.23) shows +  CNV OD; OS w/ early and late staining  - BCVA improved OU - OD 20/40, OS 20/30 - OCT shows OD Central PED with overlying patchy ORA -- no fluid -- stable at 14 wks - recommend IVA OD #5 today, 05.28.24 -- maintenance w/ ext f/u to 16 wks - pt wishes to proceed with injection - RBA of procedure  discussed, questions answered - informed consent obtained and signed - see procedure note - Avastin informed consent obtained and signed for OD 06.15.23  - f/u in 16 wks -- DFE/OCT, possible injxn  2. Age related macular degeneration, non-exudative, OS - The incidence, anatomy, and pathology of dry AMD, risk of progression, and the AREDS and AREDS 2 studies including smoking risks discussed with patient.  - Recommend amsler grid monitoring  - monitoring    3,4. Hypertensive retinopathy OU - discussed importance of tight BP control - FA 07.27.23 shows telangectasias and tortuous vessels along ST arcades -- ?old superior BRVO OS - no macular edema - monitor   5. Pseudophakia OU  - s/p CE/IOL OU (Dr. Hortense Ramal, unknown date)  - IOLs in good position, doing well  - monitor     Ophthalmic Meds Ordered this visit:  Meds ordered this encounter  Medications   Bevacizumab (AVASTIN) SOLN 1.25 mg     Return in about 16 weeks (around 09/22/2022) for f/u exu ARMD OD, DFE, OCT.  There are no Patient Instructions on file for this visit.  Explained the diagnoses, plan, and follow up with the patient and they expressed understanding.  Patient expressed understanding of the importance of proper follow up care.    This document serves as a record of services personally performed by Karie Chimera, MD, PhD. It was created on their behalf by Gerilyn Nestle, COT an ophthalmic technician. The creation of this record is the provider's dictation and/or activities during the visit.    Electronically signed by:  Gerilyn Nestle, COT  05.28.24 12:30 AM  This document serves as a record of services personally performed by Karie Chimera, MD, PhD. It was created on their behalf by Glee Arvin. Manson Passey, OA an ophthalmic technician. The creation of this record is the provider's dictation and/or activities during the visit.    Electronically signed by: Glee Arvin. Manson Passey, [place] 05.28.2024 12:30 AM  Karie Chimera, M.D., Ph.D. Diseases & Surgery of the Retina and Vitreous [facility]  I have reviewed the above documentation for accuracy and completeness, and I agree with the above. Karie Chimera, M.D., Ph.D. 06/04/22 12:32 AM  Abbreviations: M myopia (nearsighted); A astigmatism; H hyperopia (farsighted); P presbyopia; Mrx spectacle prescription;  CTL contact lenses; OD right eye; OS left eye; OU both eyes  XT exotropia; ET esotropia; PEK punctate epithelial keratitis; PEE punctate epithelial erosions; DES dry eye syndrome; MGD meibomian gland dysfunction; ATs artificial tears; PFAT's preservative free artificial tears; NSC nuclear sclerotic cataract; PSC posterior subcapsular cataract; ERM epi-retinal membrane; PVD posterior vitreous detachment; RD retinal detachment; DM diabetes mellitus; DR diabetic retinopathy; NPDR non-proliferative diabetic retinopathy; PDR proliferative diabetic retinopathy; CSME clinically significant macular edema; DME diabetic macular edema; dbh dot blot hemorrhages; CWS cotton wool spot; POAG primary open angle glaucoma; C/D cup-to-disc ratio; HVF humphrey visual field; GVF goldmann visual field; OCT optical coherence tomography; IOP intraocular pressure; BRVO Branch retinal vein occlusion; CRVO central retinal vein occlusion; CRAO central retinal artery occlusion; BRAO branch retinal artery occlusion; RT retinal tear; SB scleral buckle; PPV pars  plana vitrectomy; VH Vitreous hemorrhage; PRP panretinal laser photocoagulation; IVK intravitreal kenalog; VMT vitreomacular traction; MH Macular hole;  NVD neovascularization of the disc; NVE neovascularization elsewhere; AREDS age related eye disease study; ARMD age related macular degeneration; POAG primary open angle glaucoma; EBMD epithelial/anterior basement membrane dystrophy; ACIOL anterior chamber intraocular lens; IOL intraocular lens; PCIOL posterior chamber intraocular lens; Phaco/IOL phacoemulsification  with intraocular lens placement; PRK photorefractive keratectomy; LASIK laser assisted in situ keratomileusis; HTN hypertension; DM diabetes mellitus; COPD chronic obstructive pulmonary disease

## 2022-06-02 ENCOUNTER — Encounter (INDEPENDENT_AMBULATORY_CARE_PROVIDER_SITE_OTHER): Payer: Self-pay | Admitting: Ophthalmology

## 2022-06-02 ENCOUNTER — Ambulatory Visit (INDEPENDENT_AMBULATORY_CARE_PROVIDER_SITE_OTHER): Payer: Medicare Other | Admitting: Ophthalmology

## 2022-06-02 DIAGNOSIS — Z961 Presence of intraocular lens: Secondary | ICD-10-CM

## 2022-06-02 DIAGNOSIS — I10 Essential (primary) hypertension: Secondary | ICD-10-CM

## 2022-06-02 DIAGNOSIS — H35033 Hypertensive retinopathy, bilateral: Secondary | ICD-10-CM

## 2022-06-02 DIAGNOSIS — H353211 Exudative age-related macular degeneration, right eye, with active choroidal neovascularization: Secondary | ICD-10-CM | POA: Diagnosis not present

## 2022-06-02 DIAGNOSIS — H353112 Nonexudative age-related macular degeneration, right eye, intermediate dry stage: Secondary | ICD-10-CM | POA: Diagnosis not present

## 2022-06-02 MED ORDER — BEVACIZUMAB CHEMO INJECTION 1.25MG/0.05ML SYRINGE FOR KALEIDOSCOPE
1.2500 mg | INTRAVITREAL | Status: AC | PRN
  Administered 2022-06-02: 1.25 mg via INTRAVITREAL

## 2022-09-10 NOTE — Progress Notes (Signed)
[facility] - Clinic Note  09/22/2022    CHIEF COMPLAINT Patient presents for Retina Follow Up   HISTORY OF PRESENT ILLNESS: Alyssa [name] is a 89 y.o. female who presents to the clinic today for:   HPI     Retina Follow Up   Patient presents with  Wet AMD.  In both eyes.  This started 16 weeks ago.  Duration of 16 weeks.  Since onset it is stable.  I, the attending physician,  performed the HPI with the patient and updated documentation appropriately.        Comments   16 week retina follow up AMD OD and IVA OD pt is reporting no  vision changes noticed she denies any flashes or floaters       Last edited by Rennis Chris, MD on 09/24/2022  5:14 PM.    Pt states she has no complaints about her vision   Referring physician: Margie Billet, MD No address on file  HISTORICAL INFORMATION:   Selected notes from the MEDICAL RECORD NUMBER Referred by Dr. Hortense Ramal -- pt had cataract surgery with him in the past, not actually a current pt LEE:  Ocular Hx- PMH-    CURRENT MEDICATIONS: Current Outpatient Medications (Ophthalmic Drugs)  Medication Sig   Hypromellose 0.4 % SOLN Apply 1 drop to eye daily as needed (for dry eyes). (Patient not taking: Reported on 06/19/2021)   No current facility-administered medications for this visit. (Ophthalmic Drugs)   Current Outpatient Medications (Other)  Medication Sig   enoxaparin (LOVENOX) 40 MG/0.4ML injection Inject 0.4 mLs (40 mg total) into the skin daily for 19 days.   HYDROcodone-acetaminophen (NORCO/VICODIN) 5-325 MG tablet Take 1 tablet by mouth every 6 (six) hours as needed for moderate pain. (Patient not taking: Reported on 06/19/2021)   lisinopril (PRINIVIL,ZESTRIL) 10 MG tablet Take 5 mg by mouth as needed (for systolic blood pressure of greater than 160). (Patient not taking: Reported on 06/19/2021)   Multiple Vitamins-Minerals (PRESERVISION AREDS 2 PO) Take 1 capsule by mouth 2 (two) times daily.  (Patient not taking: Reported on 06/02/2022)   No current facility-administered medications for this visit. (Other)   REVIEW OF SYSTEMS: ROS   Positive for: Eyes Negative for: Constitutional, Gastrointestinal, Neurological, Skin, Genitourinary, Musculoskeletal, HENT, Endocrine, Cardiovascular, Respiratory, Psychiatric, Allergic/Imm, Heme/Lymph Last edited by Etheleen Mayhew, COT on 09/22/2022  1:28 PM.        ALLERGIES Allergies  Allergen Reactions   Penicillins Swelling    "probably 1970; lips and tongue felt but didn't look swollen; dr told me not to take it again"   Tape Other (See Comments)    Tears skin   PAST MEDICAL HISTORY Past Medical History:  Diagnosis Date   Arthritis    Cancer [facility])    "female; not sure if uterus or endometrosis"   Cough    occasional   Glaucoma, right eye    lens implant done for 4 to  yrs ago   High cholesterol    03/23/11 'don't take anything for it"   Hypertension    Left bundle branch block    Stroke (HCC) 03/23/11   "mini strokes; years ago; didn't go to dr;  Peggye Form had babies since then"   Thyroid disease    S/P thyroidectomy   Tinnitus of both ears    Past Surgical History:  Procedure Laterality Date   ABDOMINAL HYSTERECTOMY  1970   APPENDECTOMY  ~ 1947   BREAST ENHANCEMENT SURGERY  ~  1980   BREAST SURGERY     cadaver bone  2011   right small finger; "took more than cyst out too"   CATARACT EXTRACTION W/ INTRAOCULAR LENS IMPLANT  ~ 2010   right   ENDOVENOUS ABLATION SAPHENOUS VEIN W/ LASER  04/14/2010   left; [facility]   EYE SURGERY     HIP PINNING,CANNULATED Right 02/05/2021   Procedure: CANNULATED HIP PINNING;  Surgeon: Netta Cedars, MD;  Location: [facility];  Service: Orthopedics;  Laterality: Right;   INTRACAPSULAR CATARACT EXTRACTION  ~ 2010   left   Stab Phlebectomies   06/03/10   R leg; "for varicose veins"   THYROIDECTOMY  ~ 1970's   TONSILLECTOMY  1943   TOTAL HIP ARTHROPLASTY Left 04/06/2012   Procedure: TOTAL HIP  ARTHROPLASTY;  Surgeon: Loanne Drilling, MD;  Location: [facility];  Service: Orthopedics;  Laterality: Left;   TUBAL LIGATION  yrs ago   FAMILY HISTORY History reviewed. No pertinent family history.  SOCIAL HISTORY Social History   Tobacco Use   Smoking status: Former    Current packs/day: 0.00    Types: Cigarettes    Quit date: 01/05/1970    Years since quitting: 52.7   Smokeless tobacco: Never   Tobacco comments:    03/23/11 'never really had habit of smoking; I have smoked a few cigarettes"  Vaping Use   Vaping status: Never Used  Substance Use Topics   Alcohol use: No   Drug use: No       OPHTHALMIC EXAM:  Base Eye Exam     Visual Acuity (Snellen - Linear)       Right Left   Dist [place] 20/60 -2 20/60 -3   Dist ph [place] 20/50 20/30 -2         Tonometry (Tonopen, 1:39 PM)       Right Left   Pressure 15 18         Pupils       Pupils Dark Light Shape React APD   Right PERRL 3 2 Round Brisk None   Left PERRL 3 2 Round Brisk None         Visual Fields       Left Right    Full Full         Extraocular Movement       Right Left    Full, Ortho Full, Ortho         Neuro/Psych     Oriented x3: Yes   Mood/Affect: Normal         Dilation     Both eyes: 2.5% Phenylephrine @ 1:39 PM           Slit Lamp and Fundus Exam     External Exam       Right Left   External Normal Normal         Slit Lamp Exam       Right Left   Lids/Lashes Dermatochalasis - upper lid, Meibomian gland dysfunction Dermatochalasis - upper lid, Meibomian gland dysfunction   Conjunctiva/Sclera White and quiet White and quiet   Cornea Arcus, Well healed temporal cataract wound 1+ fine Punctate epithelial erosions   Anterior Chamber Deep and clear, narrow temporal angle Deep and clear   Iris Round and dilated, patent [id number] Round and dilated, patent PI [id number]   Lens PC IOL toric in good position with marks at 1145 and 0545, trace Posterior capsular opacification PC IOL  in good position   Anterior Vitreous  Vitreous syneresis Vitreous syneresis         Fundus Exam       Right Left   Disc mild Pallor, Sharp rim Mild Pallor, Sharp rim, Compact   C/D Ratio 0.2 0.2   Macula Flat, Blunted foveal reflex, Drusen, RPE mottling and clumping, central atrophy and fibrosis with mild pigment clumping, No heme or edema Flat, Blunted foveal reflex, Drusen, RPE mottling, clumping, and early atrophy, No heme or edema   Vessels Attenuated, Tortuous Attenuated, Tortuous, focal corkscrew vessel off ST arteriole, severe peripheral attenuation superior quad - ?old BRVO superiorly   Periphery Attached, mild Reticular degeneration, subretinal exudation and pigmented CR scarring temporal midzone Attached, No heme           IMAGING AND PROCEDURES  Imaging and Procedures for 09/22/2022  OCT, Retina - OU - Both Eyes       Right Eye Quality was good. Central Foveal Thickness: 256. Progression has been stable. Findings include no IRF, no SRF, abnormal foveal contour, retinal drusen , pigment epithelial detachment, outer retinal atrophy (Central PED with overlying patchy ORA -- no fluid -- stable).   Left Eye Quality was good. Central Foveal Thickness: 254. Progression has been stable. Findings include normal foveal contour, no IRF, no SRF, retinal drusen , pigment epithelial detachment (Low lying PED -- no fluid).   Notes *Images captured and stored on drive  Diagnosis / Impression:  OD: Exu ARMD -- central low PED--no fluid, stable OS: Nonexudative ARMD *no IRF/SRF OU  Clinical management:  See below  Abbreviations: NFP - Normal foveal profile. CME - cystoid macular edema. PED - pigment epithelial detachment. IRF - intraretinal fluid. SRF - subretinal fluid. EZ - ellipsoid zone. ERM - epiretinal membrane. ORA - outer retinal atrophy. ORT - outer retinal tubulation. SRHM - subretinal hyper-reflective material. IRHM - intraretinal hyper-reflective material             ASSESSMENT/PLAN:    ICD-10-CM   1. Exudative age-related macular degeneration of right eye with active choroidal neovascularization (HCC)  H35.3211 OCT, Retina - OU - Both Eyes    2. Intermediate stage nonexudative age-related macular degeneration of right eye  H35.3112     3. Essential hypertension  I10     4. Hypertensive retinopathy of both eyes  H35.033     5. Pseudophakia  Z96.1       1. Exudative age related macular degeneration, OD  - s/p IVA OD #1 (06.15.23), #2 (09.06.23), #3 (11.28.23), #4 (02.20.24), #5 (05.28.24) - former pt of Dr. Dahlia Client at [place] -- pt lives in [place] -- history of q12wk IVA OD -- last injection at [facility] was IVA OD 3.2.23  - FA transit OD (07.27.23) shows +CNV OD; OS w/ early and late staining  - BCVA OD decreased to 20/50 from 20/40, OS stable at 20/30 - OCT shows OD Central PED with overlying patchy ORA -- no fluid -- stable at 16 wks - recommend holding injection today - pt in agreement - Avastin informed consent obtained and signed for OD 06.15.23  - f/u in 3 months -- DFE/OCT, possible injxn  2. Age related macular degeneration, non-exudative, OS - The incidence, anatomy, and pathology of dry AMD, risk of progression, and the AREDS and AREDS 2 studies including smoking risks discussed with patient.  - Recommend amsler grid monitoring  - monitoring    3,4. Hypertensive retinopathy OU - discussed importance of tight BP control - FA 07.27.23 shows telangectasias and tortuous vessels along ST  arcades -- ?old superior BRVO OS - no macular edema - monitor   5. Pseudophakia OU  - s/p CE/IOL OU (Dr. Hortense Ramal, unknown date)  - IOLs in good position, doing well  - monitor     Ophthalmic Meds Ordered this visit:  No orders of the defined types were placed in this encounter.    Return in about 3 months (around 12/22/2022) for f/u exu ARMD OD, DFE, OCT.  There are no Patient Instructions on file for this visit.  Explained the  diagnoses, plan, and follow up with the patient and they expressed understanding.  Patient expressed understanding of the importance of proper follow up care.    This document serves as a record of services personally performed by Karie Chimera, MD, PhD. It was created on their behalf by Charlette Caffey, COT an ophthalmic technician. The creation of this record is the provider's dictation and/or activities during the visit.    Electronically signed by:  Charlette Caffey, COT  09/24/22 5:16 PM  This document serves as a record of services personally performed by Karie Chimera, MD, PhD. It was created on their behalf by Glee Arvin. Manson Passey, OA an ophthalmic technician. The creation of this record is the provider's dictation and/or activities during the visit.    Electronically signed by: Glee Arvin. Manson Passey, OA 09/24/22 5:16 PM  Karie Chimera, M.D., Ph.D. Diseases & Surgery of the Retina and Vitreous [facility]  I have reviewed the above documentation for accuracy and completeness, and I agree with the above. Karie Chimera, M.D., Ph.D. 09/24/22 5:17 PM  Abbreviations: M myopia (nearsighted); A astigmatism; H hyperopia (farsighted); P presbyopia; Mrx spectacle prescription;  CTL contact lenses; OD right eye; OS left eye; OU both eyes  XT exotropia; ET esotropia; PEK punctate epithelial keratitis; PEE punctate epithelial erosions; DES dry eye syndrome; MGD meibomian gland dysfunction; ATs artificial tears; PFAT's preservative free artificial tears; NSC nuclear sclerotic cataract; PSC posterior subcapsular cataract; ERM epi-retinal membrane; PVD posterior vitreous detachment; RD retinal detachment; DM diabetes mellitus; DR diabetic retinopathy; NPDR non-proliferative diabetic retinopathy; PDR proliferative diabetic retinopathy; CSME clinically significant macular edema; DME diabetic macular edema; dbh dot blot hemorrhages; CWS cotton wool spot; POAG primary open angle  glaucoma; C/D cup-to-disc ratio; HVF humphrey visual field; GVF goldmann visual field; OCT optical coherence tomography; IOP intraocular pressure; BRVO Branch retinal vein occlusion; CRVO central retinal vein occlusion; CRAO central retinal artery occlusion; BRAO branch retinal artery occlusion; RT retinal tear; SB scleral buckle; PPV pars plana vitrectomy; VH Vitreous hemorrhage; PRP panretinal laser photocoagulation; IVK intravitreal kenalog; VMT vitreomacular traction; MH Macular hole;  NVD neovascularization of the disc; NVE neovascularization elsewhere; AREDS age related eye disease study; ARMD age related macular degeneration; POAG primary open angle glaucoma; EBMD epithelial/anterior basement membrane dystrophy; ACIOL anterior chamber intraocular lens; IOL intraocular lens; PCIOL posterior chamber intraocular lens; Phaco/IOL phacoemulsification with intraocular lens placement; PRK photorefractive keratectomy; LASIK laser assisted in situ keratomileusis; HTN hypertension; DM diabetes mellitus; COPD chronic obstructive pulmonary disease

## 2022-09-22 ENCOUNTER — Ambulatory Visit (INDEPENDENT_AMBULATORY_CARE_PROVIDER_SITE_OTHER): Payer: Medicare Other | Admitting: Ophthalmology

## 2022-09-22 ENCOUNTER — Encounter (INDEPENDENT_AMBULATORY_CARE_PROVIDER_SITE_OTHER): Payer: Self-pay | Admitting: Ophthalmology

## 2022-09-22 DIAGNOSIS — H35033 Hypertensive retinopathy, bilateral: Secondary | ICD-10-CM

## 2022-09-22 DIAGNOSIS — H353211 Exudative age-related macular degeneration, right eye, with active choroidal neovascularization: Secondary | ICD-10-CM

## 2022-09-22 DIAGNOSIS — H353112 Nonexudative age-related macular degeneration, right eye, intermediate dry stage: Secondary | ICD-10-CM | POA: Diagnosis not present

## 2022-09-22 DIAGNOSIS — I10 Essential (primary) hypertension: Secondary | ICD-10-CM | POA: Diagnosis not present

## 2022-09-22 DIAGNOSIS — Z961 Presence of intraocular lens: Secondary | ICD-10-CM

## 2022-09-24 ENCOUNTER — Encounter (INDEPENDENT_AMBULATORY_CARE_PROVIDER_SITE_OTHER): Payer: Self-pay | Admitting: Ophthalmology

## 2022-12-23 NOTE — Progress Notes (Signed)
[facility] - Clinic Note  12/24/2022    CHIEF COMPLAINT Patient presents for Retina Follow Up   HISTORY OF PRESENT ILLNESS: Alyssa [name] is a 89 y.o. female who presents to the clinic today for:   HPI     Retina Follow Up   Patient presents with  Wet AMD.  In right eye.  This started 3 months ago.  I, the attending physician,  performed the HPI with the patient and updated documentation appropriately.        Comments   Patient here for 3 months retina follow up for exu ARMD OD. Patient states vision doing fine. Doing so,so. No eye pain.       Last edited by Rennis Chris, MD on 12/25/2022  1:03 AM.     Pt states vision is doing well   Referring physician: Margie Billet, MD No address on file  HISTORICAL INFORMATION:   Selected notes from the MEDICAL RECORD NUMBER Referred by Dr. Hortense Ramal -- pt had cataract surgery with him in the past, not actually a current pt LEE:  Ocular Hx- PMH-    CURRENT MEDICATIONS: Current Outpatient Medications (Ophthalmic Drugs)  Medication Sig   Hypromellose 0.4 % SOLN Apply 1 drop to eye daily as needed (for dry eyes). (Patient not taking: Reported on 06/19/2021)   No current facility-administered medications for this visit. (Ophthalmic Drugs)   Current Outpatient Medications (Other)  Medication Sig   enoxaparin (LOVENOX) 40 MG/0.4ML injection Inject 0.4 mLs (40 mg total) into the skin daily for 19 days.   HYDROcodone-acetaminophen (NORCO/VICODIN) 5-325 MG tablet Take 1 tablet by mouth every 6 (six) hours as needed for moderate pain. (Patient not taking: Reported on 12/24/2022)   lisinopril (PRINIVIL,ZESTRIL) 10 MG tablet Take 5 mg by mouth as needed (for systolic blood pressure of greater than 160). (Patient not taking: Reported on 06/19/2021)   Multiple Vitamins-Minerals (PRESERVISION AREDS 2 PO) Take 1 capsule by mouth 2 (two) times daily. (Patient not taking: Reported on 06/02/2022)   No current  facility-administered medications for this visit. (Other)   REVIEW OF SYSTEMS: ROS   Positive for: Eyes Negative for: Constitutional, Gastrointestinal, Neurological, Skin, Genitourinary, Musculoskeletal, HENT, Endocrine, Cardiovascular, Respiratory, Psychiatric, Allergic/Imm, Heme/Lymph Last edited by Laddie Aquas, COA on 12/24/2022  9:24 AM.         ALLERGIES Allergies  Allergen Reactions   Penicillins Swelling    "probably 1970; lips and tongue felt but didn't look swollen; dr told me not to take it again"   Tape Other (See Comments)    Tears skin   PAST MEDICAL HISTORY Past Medical History:  Diagnosis Date   Arthritis    Cancer [facility])    "female; not sure if uterus or endometrosis"   Cough    occasional   Glaucoma, right eye    lens implant done for 4 to  yrs ago   High cholesterol    03/23/11 'don't take anything for it"   Hypertension    Left bundle branch block    Stroke (HCC) 03/23/11   "mini strokes; years ago; didn't go to dr;  Peggye Form had babies since then"   Thyroid disease    S/P thyroidectomy   Tinnitus of both ears    Past Surgical History:  Procedure Laterality Date   ABDOMINAL HYSTERECTOMY  1970   APPENDECTOMY  ~ 1947   BREAST ENHANCEMENT SURGERY  ~ 1980   BREAST SURGERY     cadaver bone  2011   right small finger; "took more than cyst out too"   CATARACT EXTRACTION W/ INTRAOCULAR LENS IMPLANT  ~ 2010   right   ENDOVENOUS ABLATION SAPHENOUS VEIN W/ LASER  04/14/2010   left; [facility]SV   EYE SURGERY     HIP PINNING,CANNULATED Right 02/05/2021   Procedure: CANNULATED HIP PINNING;  Surgeon: Netta Cedars, MD;  Location: [facility] ORS;  Service: Orthopedics;  Laterality: Right;   INTRACAPSULAR CATARACT EXTRACTION  ~ 2010   left   Stab Phlebectomies   06/03/10   R leg; "for varicose veins"   THYROIDECTOMY  ~ 1970's   TONSILLECTOMY  1943   TOTAL HIP ARTHROPLASTY Left 04/06/2012   Procedure: TOTAL HIP ARTHROPLASTY;  Surgeon: Loanne Drilling, MD;  Location: [facility]  ORS;  Service: Orthopedics;  Laterality: Left;   TUBAL LIGATION  yrs ago   FAMILY HISTORY History reviewed. No pertinent family history.  SOCIAL HISTORY Social History   Tobacco Use   Smoking status: Former    Current packs/day: 0.00    Types: Cigarettes    Quit date: 01/05/1970    Years since quitting: 53.0   Smokeless tobacco: Never   Tobacco comments:    03/23/11 'never really had habit of smoking; I have smoked a few cigarettes"  Vaping Use   Vaping status: Never Used  Substance Use Topics   Alcohol use: No   Drug use: No       OPHTHALMIC EXAM:  Base Eye Exam     Visual Acuity (Snellen - Linear)       Right Left   Dist [place] 20/60 -2 20/50 -1   Dist ph [place] 20/50 20/30 -2         Tonometry (Tonopen, 9:21 AM)       Right Left   Pressure 17 19         Pupils       Dark Light Shape React APD   Right 3 2 Round Brisk None   Left 3 2 Round Brisk None         Visual Fields (Counting fingers)       Left Right    Full Full         Extraocular Movement       Right Left    Full, Ortho Full, Ortho         Neuro/Psych     Oriented x3: Yes   Mood/Affect: Normal         Dilation     Both eyes: 1.0% Mydriacyl, 2.5% Phenylephrine @ 9:21 AM           Slit Lamp and Fundus Exam     External Exam       Right Left   External Normal Normal         Slit Lamp Exam       Right Left   Lids/Lashes Dermatochalasis - upper lid, Meibomian gland dysfunction Dermatochalasis - upper lid, Meibomian gland dysfunction   Conjunctiva/Sclera White and quiet White and quiet   Cornea Arcus, Well healed temporal cataract wound, 2+ fine Punctate epithelial erosions inferiorly 1-2+ fine inferior Punctate epithelial erosions   Anterior Chamber Deep and clear, narrow temporal angle Deep and clear   Iris Round and dilated, patent [id number] Round and dilated, patent PI [id number]   Lens PC IOL toric in good position with marks at 1145 and 0545, trace Posterior capsular  opacification PC IOL in good position   Anterior Vitreous mild syneresis mild syneresis  Fundus Exam       Right Left   Disc mild Pallor, Sharp rim Mild Pallor, Sharp rim, Compact   C/D Ratio 0.2 0.2   Macula Flat, Blunted foveal reflex, Drusen, RPE mottling and clumping, central atrophy and fibrosis with mild pigment clumping, No heme or edema Flat, Blunted foveal reflex, Drusen, RPE mottling, clumping, and early atrophy, No heme or edema   Vessels Attenuated, Tortuous Attenuated, Tortuous, focal corkscrew vessel off ST arteriole, severe peripheral attenuation superior quad - ?old BRVO superiorly   Periphery Attached, mild Reticular degeneration, subretinal exudation and pigmented CR scarring temporal midzone Attached, No heme           IMAGING AND PROCEDURES  Imaging and Procedures for 12/24/2022  OCT, Retina - OU - Both Eyes       Right Eye Quality was good. Central Foveal Thickness: 267. Progression has been stable. Findings include no IRF, no SRF, abnormal foveal contour, retinal drusen , pigment epithelial detachment, outer retinal atrophy (Central PED with overlying patchy ORA -- no fluid -- stable).   Left Eye Quality was good. Central Foveal Thickness: 265. Progression has been stable. Findings include normal foveal contour, no IRF, no SRF, retinal drusen , pigment epithelial detachment (Low lying PED -- no fluid).   Notes *Images captured and stored on drive  Diagnosis / Impression:  OD: Exu ARMD -- central low PED--no fluid, stable OS: Nonexudative ARMD *no IRF/SRF OU  Clinical management:  See below  Abbreviations: NFP - Normal foveal profile. CME - cystoid macular edema. PED - pigment epithelial detachment. IRF - intraretinal fluid. SRF - subretinal fluid. EZ - ellipsoid zone. ERM - epiretinal membrane. ORA - outer retinal atrophy. ORT - outer retinal tubulation. SRHM - subretinal hyper-reflective material. IRHM - intraretinal hyper-reflective material             ASSESSMENT/PLAN:    ICD-10-CM   1. Exudative age-related macular degeneration of right eye with active choroidal neovascularization (HCC)  H35.3211 OCT, Retina - OU - Both Eyes    2. Intermediate stage nonexudative age-related macular degeneration of right eye  H35.3112     3. Essential hypertension  I10     4. Hypertensive retinopathy of both eyes  H35.033     5. Pseudophakia  Z96.1      1. Exudative age related macular degeneration, OD  - s/p IVA OD #1 (06.15.23), #2 (09.06.23), #3 (11.28.23), #4 (02.20.24), #5 (05.28.24) - former pt of Dr. Dahlia Client at [facility] -- pt lives in [place] -- history of q12wk IVA OD -- last injection at [facility] was IVA OD 3.2.23  - FA transit OD (07.27.23) shows +CNV OD; OS w/ early and late staining  - BCVA OD stable at 20/50, OS stable at 20/30 - OCT shows OD Central PED with overlying patchy ORA -- no fluid -- stable at 7 months since last injection - recommend holding injection today - pt in agreement - Avastin informed consent obtained and signed for OD 06.15.23  - f/u in 4 months -- DFE/OCT, possible injxn  2. Age related macular degeneration, non-exudative, OS - The incidence, anatomy, and pathology of dry AMD, risk of progression, and the AREDS and AREDS 2 studies including smoking risks discussed with patient.  - Recommend amsler grid monitoring  - monitoring  3,4. Hypertensive retinopathy OU - discussed importance of tight BP control - FA 07.27.23 shows telangectasias and tortuous vessels along ST arcades -- ?old superior BRVO OS - no macular edema - monitor  5.  Pseudophakia OU  - s/p CE/IOL OU (Dr. Hortense Ramal, unknown date)  - IOLs in good position, doing well  - monitor   Ophthalmic Meds Ordered this visit:  No orders of the defined types were placed in this encounter.    Return in about 4 months (around 04/24/2023) for f/u exu ARMD OD, DFE, OCT.  There are no Patient Instructions on file for this  visit.  Explained the diagnoses, plan, and follow up with the patient and they expressed understanding.  Patient expressed understanding of the importance of proper follow up care.    This document serves as a record of services personally performed by Karie Chimera, MD, PhD. It was created on their behalf by Annalee Genta, COMT. The creation of this record is the provider's dictation and/or activities during the visit.  Electronically signed by: Annalee Genta, COMT 12/25/22 1:04 AM  This document serves as a record of services personally performed by Karie Chimera, MD, PhD. It was created on their behalf by Glee Arvin. Manson Passey, OA an ophthalmic technician. The creation of this record is the provider's dictation and/or activities during the visit.    Electronically signed by: Glee Arvin. Manson Passey, OA 12/25/22 1:04 AM  Karie Chimera, M.D., Ph.D. Diseases & Surgery of the Retina and Vitreous [facility]  I have reviewed the above documentation for accuracy and completeness, and I agree with the above. Karie Chimera, M.D., Ph.D. 12/25/22 1:05 AM   Abbreviations: M myopia (nearsighted); A astigmatism; H hyperopia (farsighted); P presbyopia; Mrx spectacle prescription;  CTL contact lenses; OD right eye; OS left eye; OU both eyes  XT exotropia; ET esotropia; PEK punctate epithelial keratitis; PEE punctate epithelial erosions; DES dry eye syndrome; MGD meibomian gland dysfunction; ATs artificial tears; PFAT's preservative free artificial tears; NSC nuclear sclerotic cataract; PSC posterior subcapsular cataract; ERM epi-retinal membrane; PVD posterior vitreous detachment; RD retinal detachment; DM diabetes mellitus; DR diabetic retinopathy; NPDR non-proliferative diabetic retinopathy; PDR proliferative diabetic retinopathy; CSME clinically significant macular edema; DME diabetic macular edema; dbh dot blot hemorrhages; CWS cotton wool spot; POAG primary open angle glaucoma; C/D  cup-to-disc ratio; HVF humphrey visual field; GVF goldmann visual field; OCT optical coherence tomography; IOP intraocular pressure; BRVO Branch retinal vein occlusion; CRVO central retinal vein occlusion; CRAO central retinal artery occlusion; BRAO branch retinal artery occlusion; RT retinal tear; SB scleral buckle; PPV pars plana vitrectomy; VH Vitreous hemorrhage; PRP panretinal laser photocoagulation; IVK intravitreal kenalog; VMT vitreomacular traction; MH Macular hole;  NVD neovascularization of the disc; NVE neovascularization elsewhere; AREDS age related eye disease study; ARMD age related macular degeneration; POAG primary open angle glaucoma; EBMD epithelial/anterior basement membrane dystrophy; ACIOL anterior chamber intraocular lens; IOL intraocular lens; PCIOL posterior chamber intraocular lens; Phaco/IOL phacoemulsification with intraocular lens placement; PRK photorefractive keratectomy; LASIK laser assisted in situ keratomileusis; HTN hypertension; DM diabetes mellitus; COPD chronic obstructive pulmonary disease

## 2022-12-24 ENCOUNTER — Encounter (INDEPENDENT_AMBULATORY_CARE_PROVIDER_SITE_OTHER): Payer: Self-pay | Admitting: Ophthalmology

## 2022-12-24 ENCOUNTER — Ambulatory Visit (INDEPENDENT_AMBULATORY_CARE_PROVIDER_SITE_OTHER): Payer: Medicare Other | Admitting: Ophthalmology

## 2022-12-24 DIAGNOSIS — H35033 Hypertensive retinopathy, bilateral: Secondary | ICD-10-CM

## 2022-12-24 DIAGNOSIS — Z961 Presence of intraocular lens: Secondary | ICD-10-CM

## 2022-12-24 DIAGNOSIS — H353112 Nonexudative age-related macular degeneration, right eye, intermediate dry stage: Secondary | ICD-10-CM

## 2022-12-24 DIAGNOSIS — H353211 Exudative age-related macular degeneration, right eye, with active choroidal neovascularization: Secondary | ICD-10-CM

## 2022-12-24 DIAGNOSIS — I10 Essential (primary) hypertension: Secondary | ICD-10-CM

## 2022-12-25 ENCOUNTER — Encounter (INDEPENDENT_AMBULATORY_CARE_PROVIDER_SITE_OTHER): Payer: Self-pay | Admitting: Ophthalmology

## 2023-02-15 ENCOUNTER — Encounter (HOSPITAL_COMMUNITY): Payer: Self-pay

## 2023-02-15 ENCOUNTER — Emergency Department (HOSPITAL_COMMUNITY): Payer: Medicare Other

## 2023-02-15 ENCOUNTER — Other Ambulatory Visit: Payer: Self-pay

## 2023-02-15 ENCOUNTER — Emergency Department (HOSPITAL_COMMUNITY)
Admission: EM | Admit: 2023-02-15 | Discharge: 2023-02-15 | Disposition: A | Discharge status: Home / Self Care | Payer: Medicare Other | Attending: Emergency Medicine | Admitting: Emergency Medicine

## 2023-02-15 DIAGNOSIS — S60512A Abrasion of left hand, initial encounter: Secondary | ICD-10-CM | POA: Insufficient documentation

## 2023-02-15 DIAGNOSIS — Y92481 Parking lot as the place of occurrence of the external cause: Secondary | ICD-10-CM | POA: Insufficient documentation

## 2023-02-15 DIAGNOSIS — Z23 Encounter for immunization: Secondary | ICD-10-CM | POA: Diagnosis not present

## 2023-02-15 DIAGNOSIS — S50812A Abrasion of left forearm, initial encounter: Secondary | ICD-10-CM | POA: Diagnosis not present

## 2023-02-15 DIAGNOSIS — S60415A Abrasion of left ring finger, initial encounter: Secondary | ICD-10-CM | POA: Diagnosis not present

## 2023-02-15 DIAGNOSIS — W01198A Fall on same level from slipping, tripping and stumbling with subsequent striking against other object, initial encounter: Secondary | ICD-10-CM | POA: Diagnosis not present

## 2023-02-15 DIAGNOSIS — S0990XA Unspecified injury of head, initial encounter: Secondary | ICD-10-CM | POA: Diagnosis present

## 2023-02-15 DIAGNOSIS — S0081XA Abrasion of other part of head, initial encounter: Secondary | ICD-10-CM | POA: Diagnosis not present

## 2023-02-15 DIAGNOSIS — W19XXXA Unspecified fall, initial encounter: Secondary | ICD-10-CM

## 2023-02-15 MED ORDER — TETANUS-DIPHTH-ACELL PERTUSSIS 5-2.5-18.5 LF-MCG/0.5 IM SUSY
0.5000 mL | PREFILLED_SYRINGE | Freq: Once | INTRAMUSCULAR | Status: AC
  Administered 2023-02-15: 0.5 mL via INTRAMUSCULAR
  Filled 2023-02-15: qty 0.5

## 2023-02-15 NOTE — ED Triage Notes (Signed)
 Tripped on uneven surface today, fell and hit head, not on blood thinners. Pt states after she hit head, she was out of it for a few minutes. Skin tear to left forearm and left hand. Denies pain

## 2023-02-15 NOTE — ED Provider Notes (Signed)
[place] EMERGENCY DEPARTMENT AT [facility] Provider Note   CSN: [id number] Arrival date & time: 02/15/23  1440     History  Chief Complaint  Patient presents with   Fall   Head Injury    Alyssa [name] is a 89 y.o. female who presents for mechanical fall from standing.  Patient states she fell and the parking lot and hit her head loss.  Consciousness.  This was a witnessed fall.  She is not on blood thinners.  She sustained abrasions to her left upper extremity.  Upon presentation she complained of feeling a little woozy.  However during my examination she is entirely asymptomatic.  She denies any pain to her upper extremity and denies interest in x-rays.   Fall  Head Injury      Home Medications Prior to Admission medications   Medication Sig Start Date End Date Taking? Authorizing Provider  enoxaparin (LOVENOX) 40 MG/0.4ML injection Inject 0.4 mLs (40 mg total) into the skin daily for 19 days. 02/08/21 02/27/21  Meredeth Ide, MD  HYDROcodone-acetaminophen (NORCO/VICODIN) 5-325 MG tablet Take 1 tablet by mouth every 6 (six) hours as needed for moderate pain. Patient not taking: Reported on 12/24/2022 02/07/21   Meredeth Ide, MD  Hypromellose 0.4 % SOLN Apply 1 drop to eye daily as needed (for dry eyes). Patient not taking: Reported on 06/19/2021    [provider]  lisinopril (PRINIVIL,ZESTRIL) 10 MG tablet Take 5 mg by mouth as needed (for systolic blood pressure of greater than 160). Patient not taking: Reported on 06/19/2021    [provider]  Multiple Vitamins-Minerals (PRESERVISION AREDS 2 PO) Take 1 capsule by mouth 2 (two) times daily. Patient not taking: Reported on 06/02/2022    [provider]      Allergies    Penicillins and Tape    Review of Systems   Review of Systems  All other systems reviewed and are negative.   Physical Exam Updated Vital Signs BP (!) 176/98   Pulse 90   Temp 98 F (36.7 C) (Oral)   Resp  20   Ht 5\' 2"  (1.575 m)   Wt 56.7 kg   SpO2 96%   BMI 22.86 kg/m  Physical Exam Vitals and nursing note reviewed.  Constitutional:      General: She is not in acute distress.    Appearance: She is well-developed.  HENT:     Head:     Comments: Mild superficial abrasion to left temple without gross deformity or step-off Eyes:     Conjunctiva/sclera: Conjunctivae normal.  Cardiovascular:     Rate and Rhythm: Normal rate and regular rhythm.     Heart sounds: No murmur heard. Pulmonary:     Effort: Pulmonary effort is normal. No respiratory distress.     Breath sounds: Normal breath sounds.  Abdominal:     Palpations: Abdomen is soft.     Tenderness: There is no abdominal tenderness.  Musculoskeletal:        General: No swelling.     Cervical back: Normal range of motion and neck supple. No tenderness.     Comments: Patient has abrasions to her left forearm, palm, left ring finger tuft.  No evidence of retained foreign body.  Full range of motion of wrist and elbow, capable of making full fist, radial pulses intact, NVI.  No tenderness over bilateral shoulders hips and knees.  Skin:    General: Skin is warm and dry.  Capillary Refill: Capillary refill takes less than 2 seconds.  Neurological:     General: No focal deficit present.     Mental Status: She is alert and oriented to person, place, and time.     Cranial Nerves: No cranial nerve deficit.     Motor: No weakness.     Comments: No facial droop or abnormal phonation, PERRL, EOMI, no nystagmus  Psychiatric:        Mood and Affect: Mood normal.            ED Results / Procedures / Treatments   Labs (all labs ordered are listed, but only abnormal results are displayed) Labs Reviewed - No data to display  EKG None  Radiology CT Head Wo Contrast Result Date: 02/15/2023 CLINICAL DATA:  Head trauma, minor (Age >= 65y); Neck trauma (Age >= 65y). Trip and fall. EXAM: CT HEAD WITHOUT CONTRAST CT CERVICAL SPINE  WITHOUT CONTRAST TECHNIQUE: Multidetector CT imaging of the head and cervical spine was performed following the standard protocol without intravenous contrast. Multiplanar CT image reconstructions of the cervical spine were also generated. RADIATION DOSE REDUCTION: This exam was performed according to the departmental dose-optimization program which includes automated exposure control, adjustment of the mA and/or kV according to patient size and/or use of iterative reconstruction technique. COMPARISON:  CT head and cervical spine 02/05/2021 FINDINGS: CT HEAD FINDINGS Brain: There is no evidence of an acute infarct, intracranial hemorrhage, mass, midline shift, or extra-axial fluid collection. There is mild cerebral atrophy. Periventricular white matter hypodensities are similar to the prior study and are nonspecific but compatible with mild chronic small vessel ischemic disease. A small chronic left cerebellar infarct is unchanged. Vascular: No hyperdense vessel. Skull: No acute fracture or suspicious osseous lesion. Sinuses/Orbits: Visualized paranasal sinuses and mastoid air cells are clear. Bilateral cataract extraction. Other: None. CT CERVICAL SPINE FINDINGS Alignment: Chronic straightening of the normal cervical lordosis with trace anterolisthesis of C4 on C5, C6 on C7, and C7 on T1. Skull base and vertebrae: No acute fracture or suspicious osseous lesion. Moderate T1-2 arthropathy. Soft tissues and spinal canal: No prevertebral fluid or swelling. No visible canal hematoma. Disc levels: Partial interbody and facet ankylosis at C2-3 and T2-3. Moderate disc space narrowing from C3-4 through C5-6. Widespread advanced facet arthrosis, asymmetrically severe on the left and in the mid and lower cervical spine. Moderate to severe multilevel neural foraminal stenosis, particularly severe on the left at C3-4. Moderate spinal stenosis at C5-6. Upper chest: Clear lung apices. Other: Moderate atherosclerotic calcification  at the carotid bifurcations. IMPRESSION: 1. No evidence of acute intracranial abnormality or cervical spine fracture. 2. Mild chronic small vessel ischemic disease. 3. Moderate disc degeneration and advanced facet arthrosis in the cervical spine. Electronically Signed   By: Sebastian Ache M.D.   On: 02/15/2023 18:09   CT Cervical Spine Wo Contrast Result Date: 02/15/2023 CLINICAL DATA:  Head trauma, minor (Age >= 65y); Neck trauma (Age >= 65y). Trip and fall. EXAM: CT HEAD WITHOUT CONTRAST CT CERVICAL SPINE WITHOUT CONTRAST TECHNIQUE: Multidetector CT imaging of the head and cervical spine was performed following the standard protocol without intravenous contrast. Multiplanar CT image reconstructions of the cervical spine were also generated. RADIATION DOSE REDUCTION: This exam was performed according to the departmental dose-optimization program which includes automated exposure control, adjustment of the mA and/or kV according to patient size and/or use of iterative reconstruction technique. COMPARISON:  CT head and cervical spine 02/05/2021 FINDINGS: CT HEAD FINDINGS Brain: There is  no evidence of an acute infarct, intracranial hemorrhage, mass, midline shift, or extra-axial fluid collection. There is mild cerebral atrophy. Periventricular white matter hypodensities are similar to the prior study and are nonspecific but compatible with mild chronic small vessel ischemic disease. A small chronic left cerebellar infarct is unchanged. Vascular: No hyperdense vessel. Skull: No acute fracture or suspicious osseous lesion. Sinuses/Orbits: Visualized paranasal sinuses and mastoid air cells are clear. Bilateral cataract extraction. Other: None. CT CERVICAL SPINE FINDINGS Alignment: Chronic straightening of the normal cervical lordosis with trace anterolisthesis of C4 on C5, C6 on C7, and C7 on T1. Skull base and vertebrae: No acute fracture or suspicious osseous lesion. Moderate T1-2 arthropathy. Soft tissues and spinal  canal: No prevertebral fluid or swelling. No visible canal hematoma. Disc levels: Partial interbody and facet ankylosis at C2-3 and T2-3. Moderate disc space narrowing from C3-4 through C5-6. Widespread advanced facet arthrosis, asymmetrically severe on the left and in the mid and lower cervical spine. Moderate to severe multilevel neural foraminal stenosis, particularly severe on the left at C3-4. Moderate spinal stenosis at C5-6. Upper chest: Clear lung apices. Other: Moderate atherosclerotic calcification at the carotid bifurcations. IMPRESSION: 1. No evidence of acute intracranial abnormality or cervical spine fracture. 2. Mild chronic small vessel ischemic disease. 3. Moderate disc degeneration and advanced facet arthrosis in the cervical spine. Electronically Signed   By: Sebastian Ache M.D.   On: 02/15/2023 18:09    Procedures Procedures    Medications Ordered in ED Medications  Tdap (BOOSTRIX) injection 0.5 mL (has no administration in time range)    ED Course/ Medical Decision Making/ A&P                                 Medical Decision Making  This patient presents to the ED with chief complaint(s) of mechanical fall.  The complaint involves an extensive differential diagnosis and also carries with it a high risk of complications and morbidity.   pertinent past medical history as listed in HPI  The differential diagnosis includes  Intracranial hemorrhage,   Additional history obtained: Additional history obtained from family   Initial Assessment:   Patient presents following mechanical fall.  She does have abrasions to her left upper extremity.  These are superficial and would not benefit from sutures.  Will give her tetanus booster.  She has no pain over her sites of abrasion and has full range of motion of her joints.  Low suspicion for fracture.  Offered x-rays however she declines.  CT imaging is without any evidence of intracranial abnormality.  Her neuroexam is benign.   Anticipate discharge home with wound care.  Independent ECG interpretation:  none  Independent labs interpretation:  The following labs were independently interpreted:  none  Independent visualization and interpretation of imaging: I independently visualized the following imaging with scope of interpretation limited to determining acute life threatening conditions related to emergency care: CT head and neck, which revealed no acute abnormality  Treatment and Reassessment: Patient given boostrix, wounds placed in sterile dressing  Consultations obtained:   none  Disposition:   Patient will be discharged home in sterile dressing. The patient has been appropriately medically screened and/or stabilized in the ED. I have low suspicion for any other emergent medical condition which would require further screening, evaluation or treatment in the ED or require inpatient management. At time of discharge the patient is hemodynamically stable and in no  acute distress. I have discussed work-up results and diagnosis with patient and answered all questions. Patient is agreeable with discharge plan. We discussed strict return precautions for returning to the emergency department and they verbalized understanding.     Social Determinants of Health:   none  This note was dictated with voice recognition software.  Despite best efforts at proofreading, errors may have occurred which can change the documentation meaning.          Final Clinical Impression(s) / ED Diagnoses Final diagnoses:  Fall, initial encounter    Rx / DC Orders ED Discharge Orders     None         Fabienne Bruns 02/15/23 2330    Bethann Berkshire, MD 02/17/23 1309

## 2023-02-15 NOTE — ED Provider Triage Note (Signed)
Emergency Medicine Provider Triage Evaluation Note  Alyssa [name] , a 89 y.o. female  was evaluated in triage.  Pt complains of mechanical fall 2 hours ago, hit head and now feels "woozy." Fall was witnessed and endorses LOC 10-15 seconds. Not on blood thinners. Also reports abrasions, lacerations on L hand and L arm. Has ambulated since fall.   Endorses new blurry vision in R eye, neck soreness,   Denies headaches, UE pain, LE pain, chest pain, shortness of breath, n/v, numbness, tingling.   Review of Systems  Positive: N/a Negative: N/a  Physical Exam  BP (!) 176/98   Pulse 90   Temp 98 F (36.7 C) (Oral)   Resp 20   Ht 5\' 2"  (1.575 m)   Wt 56.7 kg   SpO2 96%   BMI 22.86 kg/m  Gen:   Awake, no distress   Resp:  Normal effort  MSK:   Moves extremities without difficulty  Other:    Medical Decision Making  Medically screening exam initiated at 4:07 PM.  Appropriate orders placed.  DERRICK ORRIS was informed that the remainder of the evaluation will be completed by another provider, this initial triage assessment does not replace that evaluation, and the importance of remaining in the ED until their evaluation is complete.     Lunette Stands, [place] 02/15/23 [phone]

## 2023-02-15 NOTE — ED Notes (Signed)
 Swelling to the left side of face

## 2023-02-15 NOTE — Discharge Instructions (Signed)
 You were evaluated in the emergency room following a fall.  Your CT imaging did not show any acute abnormality.  Your wounds were cleansed and placed in sterile dressing.  Please keep these clean and dry and cleanse with soapy water .  You experience any new or worsening symptoms including fevers, chills, worsening pain, dizziness, blurry vision please return to the emergency room.

## 2023-04-21 NOTE — Progress Notes (Shared)
 [facility] - Clinic Note  04/22/2023    CHIEF COMPLAINT Patient presents for Retina Follow Up   HISTORY OF PRESENT ILLNESS: Alyssa [name] is a 89 y.o. female who presents to the clinic today for:   HPI     Retina Follow Up   Patient presents with  Wet AMD.  In right eye.  This started 4 months ago.  I, the attending physician,  performed the HPI with the patient and updated documentation appropriately.        Comments   Patient here for 4 months retina follow up for exu ARMD OD. Patient states vision doing the same. Uses AT's prn. No eye pain.      Last edited by Rennis Chris, MD on 04/22/2023 10:43 AM.    Pt states vision the same OU.   Referring physician: Margie Billet, MD No address on file  HISTORICAL INFORMATION:   Selected notes from the MEDICAL RECORD NUMBER Referred by Dr. Hortense Ramal -- pt had cataract surgery with him in the past, not actually a current pt LEE:  Ocular Hx- PMH-    CURRENT MEDICATIONS: Current Outpatient Medications (Ophthalmic Drugs)  Medication Sig   Hypromellose 0.4 % SOLN Apply 1 drop to eye daily as needed (for dry eyes). (Patient not taking: Reported on 06/19/2021)   No current facility-administered medications for this visit. (Ophthalmic Drugs)   Current Outpatient Medications (Other)  Medication Sig   enoxaparin (LOVENOX) 40 MG/0.4ML injection Inject 0.4 mLs (40 mg total) into the skin daily for 19 days.   HYDROcodone-acetaminophen (NORCO/VICODIN) 5-325 MG tablet Take 1 tablet by mouth every 6 (six) hours as needed for moderate pain. (Patient not taking: Reported on 06/19/2021)   lisinopril (PRINIVIL,ZESTRIL) 10 MG tablet Take 5 mg by mouth as needed (for systolic blood pressure of greater than 160). (Patient not taking: Reported on 06/19/2021)   Multiple Vitamins-Minerals (PRESERVISION AREDS 2 PO) Take 1 capsule by mouth 2 (two) times daily. (Patient not taking: Reported on 06/02/2022)   No current  facility-administered medications for this visit. (Other)   REVIEW OF SYSTEMS: ROS   Positive for: Eyes Negative for: Constitutional, Gastrointestinal, Neurological, Skin, Genitourinary, Musculoskeletal, HENT, Endocrine, Cardiovascular, Respiratory, Psychiatric, Allergic/Imm, Heme/Lymph Last edited by Laddie Aquas, COA on 04/22/2023  8:41 AM.     ALLERGIES Allergies  Allergen Reactions   Penicillins Swelling    "probably 1970; lips and tongue felt but didn't look swollen; dr told me not to take it again"   Tape Other (See Comments)    Tears skin   PAST MEDICAL HISTORY Past Medical History:  Diagnosis Date   Arthritis    Cancer [facility])    "female; not sure if uterus or endometrosis"   Cough    occasional   Glaucoma, right eye    lens implant done for 4 to  yrs ago   High cholesterol    03/23/11 'don't take anything for it"   Hypertension    Left bundle branch block    Stroke (HCC) 03/23/11   "mini strokes; years ago; didn't go to dr;  Peggye Form had babies since then"   Thyroid disease    S/P thyroidectomy   Tinnitus of both ears    Past Surgical History:  Procedure Laterality Date   ABDOMINAL HYSTERECTOMY  1970   APPENDECTOMY  ~ 1947   BREAST ENHANCEMENT SURGERY  ~ 1980   BREAST SURGERY     cadaver bone  2011   right small  finger; "took more than cyst out too"   CATARACT EXTRACTION W/ INTRAOCULAR LENS IMPLANT  ~ 2010   right   ENDOVENOUS ABLATION SAPHENOUS VEIN W/ LASER  04/14/2010   left; [facility]SV   EYE SURGERY     HIP PINNING,CANNULATED Right 02/05/2021   Procedure: CANNULATED HIP PINNING;  Surgeon: Netta Cedars, MD;  Location: [facility];  Service: Orthopedics;  Laterality: Right;   INTRACAPSULAR CATARACT EXTRACTION  ~ 2010   left   Stab Phlebectomies   06/03/10   R leg; "for varicose veins"   THYROIDECTOMY  ~ 1970's   TONSILLECTOMY  1943   TOTAL HIP ARTHROPLASTY Left 04/06/2012   Procedure: TOTAL HIP ARTHROPLASTY;  Surgeon: Loanne Drilling, MD;  Location: [facility];   Service: Orthopedics;  Laterality: Left;   TUBAL LIGATION  yrs ago   FAMILY HISTORY History reviewed. No pertinent family history.  SOCIAL HISTORY Social History   Tobacco Use   Smoking status: Former    Current packs/day: 0.00    Types: Cigarettes    Quit date: 01/05/1970    Years since quitting: 53.3   Smokeless tobacco: Never   Tobacco comments:    03/23/11 'never really had habit of smoking; I have smoked a few cigarettes"  Vaping Use   Vaping status: Never Used  Substance Use Topics   Alcohol use: No   Drug use: No       OPHTHALMIC EXAM:  Base Eye Exam     Visual Acuity (Snellen - Linear)       Right Left   Dist [place] 20/60 20/40 -1   Dist ph [place] 20/50 +2 20/30 -2         Tonometry (Tonopen, 8:38 AM)       Right Left   Pressure 16 19         Pupils       Dark Light Shape React APD   Right 3 2 Round Brisk None   Left 3 2 Round Brisk None         Visual Fields (Counting fingers)       Left Right    Full Full         Extraocular Movement       Right Left    Full, Ortho Full, Ortho         Neuro/Psych     Oriented x3: Yes   Mood/Affect: Normal         Dilation     Both eyes: 1.0% Mydriacyl, 2.5% Phenylephrine @ 8:38 AM           Slit Lamp and Fundus Exam     External Exam       Right Left   External Normal Normal         Slit Lamp Exam       Right Left   Lids/Lashes Dermatochalasis - upper lid, Meibomian gland dysfunction Dermatochalasis - upper lid, Meibomian gland dysfunction   Conjunctiva/Sclera White and quiet White and quiet   Cornea Arcus, Well healed temporal cataract wound, trace fine Punctate epithelial erosions inferiorly 1-2+ fine inferior Punctate epithelial erosions, well healed cataract wound   Anterior Chamber Deep and clear, narrow temporal angle Deep and clear   Iris Round and dilated, patent [id number] Round and dilated, patent [id number]   Lens PC IOL toric in good position with marks at 1145 and 0545,  trace Posterior capsular opacification PC IOL in good position   Anterior Vitreous mild syneresis mild syneresis  Fundus Exam       Right Left   Disc mild Pallor, Sharp rim Mild Pallor, Sharp rim, Compact   C/D Ratio 0.2 0.2   Macula Flat, Blunted foveal reflex, Drusen, RPE mottling and clumping, central atrophy and fibrosis with mild pigment clumping, No heme or edema Flat, Blunted foveal reflex, Drusen, RPE mottling, clumping, and early atrophy, No heme or edema   Vessels Attenuated, Tortuous Attenuated, Tortuous, focal corkscrew vessel off ST arteriole, severe peripheral attenuation superior quad - ?old BRVO superiorly   Periphery Attached, mild Reticular degeneration, subretinal exudation and pigmented CR scarring temporal midzone, no heme Attached, No heme           IMAGING AND PROCEDURES  Imaging and Procedures for 04/22/2023  OCT, Retina - OU - Both Eyes       Right Eye Quality was good. Central Foveal Thickness: 266. Progression has been stable. Findings include no IRF, no SRF, abnormal foveal contour, retinal drusen , pigment epithelial detachment, outer retinal atrophy (Central PED with overlying patchy ORA -- no fluid -- stable).   Left Eye Quality was good. Central Foveal Thickness: 257. Progression has been stable. Findings include normal foveal contour, no IRF, no SRF, retinal drusen , pigment epithelial detachment (Low lying PED -- no fluid).   Notes *Images captured and stored on drive  Diagnosis / Impression:  OD: Exu ARMD -- central low PED--no fluid, stable OS: Nonexudative ARMD *no IRF/SRF OU  Clinical management:  See below  Abbreviations: NFP - Normal foveal profile. CME - cystoid macular edema. PED - pigment epithelial detachment. IRF - intraretinal fluid. SRF - subretinal fluid. EZ - ellipsoid zone. ERM - epiretinal membrane. ORA - outer retinal atrophy. ORT - outer retinal tubulation. SRHM - subretinal hyper-reflective material. IRHM -  intraretinal hyper-reflective material            ASSESSMENT/PLAN:    ICD-10-CM   1. Exudative age-related macular degeneration of right eye with inactive choroidal neovascularization (HCC)  H35.3212 OCT, Retina - OU - Both Eyes    2. Intermediate stage nonexudative age-related macular degeneration of right eye  H35.3112     3. Essential hypertension  I10     4. Hypertensive retinopathy of both eyes  H35.033     5. Pseudophakia  Z96.1       1. Exudative age related macular degeneration, OD  - s/p IVA OD #1 (06.15.23), #2 (09.06.23), #3 (11.28.23), #4 (02.20.24), #5 (05.28.24) - former pt of Dr. Dahlia Client at [place] -- pt lives in [place] -- history of q12wk IVA OD -- last injection at [facility] was IVA OD 3.2.23  - FA transit OD (07.27.23) shows +CNV OD; OS w/ early and late staining  - BCVA OD stable at 20/50, OS stable at 20/30 - OCT shows OD Central PED with overlying patchy ORA -- no fluid -- stable at 11 months since last injection - recommend holding injection again today - pt in agreement - Avastin informed consent obtained and signed for OD 06.15.23  - f/u in 6 months -- DFE/OCT, possible injxn  2. Age related macular degeneration, non-exudative, OS - The incidence, anatomy, and pathology of dry AMD, risk of progression, and the AREDS and AREDS 2 studies including smoking risks discussed with patient.  - Recommend amsler grid monitoring  - monitoring  3,4. Hypertensive retinopathy OU - discussed importance of tight BP control - FA 07.27.23 shows telangectasias and tortuous vessels along ST arcades -- ?old superior BRVO OS - no macular edema -  monitor  5. Pseudophakia OU  - s/p CE/IOL OU (Dr. Meridee Standing, unknown date)  - IOLs in good position, doing well  - monitor  Ophthalmic Meds Ordered this visit:  No orders of the defined types were placed in this encounter.    Return in about 6 months (around 10/22/2023) for h/o ex ARMD OD - DFE, OCT.  There are no  Patient Instructions on file for this visit.  Explained the diagnoses, plan, and follow up with the patient and they expressed understanding.  Patient expressed understanding of the importance of proper follow up care.    This document serves as a record of services personally performed by Jeanice Millard, MD, PhD. It was created on their behalf by Diona Franklin, COMT. The creation of this record is the provider's dictation and/or activities during the visit.  Electronically signed by: Diona Franklin, COMT 04/22/23 11:25 AM  Jeanice Millard, M.D., Ph.D. Diseases & Surgery of the Retina and Vitreous [facility]  I have reviewed the above documentation for accuracy and completeness, and I agree with the above. Jeanice Millard, M.D., Ph.D. 04/22/23 11:25 AM    Abbreviations: M myopia (nearsighted); A astigmatism; H hyperopia (farsighted); P presbyopia; Mrx spectacle prescription;  CTL contact lenses; OD right eye; OS left eye; OU both eyes  XT exotropia; ET esotropia; PEK punctate epithelial keratitis; PEE punctate epithelial erosions; DES dry eye syndrome; MGD meibomian gland dysfunction; ATs artificial tears; PFAT's preservative free artificial tears; NSC nuclear sclerotic cataract; PSC posterior subcapsular cataract; ERM epi-retinal membrane; PVD posterior vitreous detachment; RD retinal detachment; DM diabetes mellitus; DR diabetic retinopathy; NPDR non-proliferative diabetic retinopathy; PDR proliferative diabetic retinopathy; CSME clinically significant macular edema; DME diabetic macular edema; dbh dot blot hemorrhages; CWS cotton wool spot; POAG primary open angle glaucoma; C/D cup-to-disc ratio; HVF humphrey visual field; GVF goldmann visual field; OCT optical coherence tomography; IOP intraocular pressure; BRVO Branch retinal vein occlusion; CRVO central retinal vein occlusion; CRAO central retinal artery occlusion; BRAO branch retinal artery occlusion; RT retinal tear; SB  scleral buckle; PPV pars plana vitrectomy; VH Vitreous hemorrhage; PRP panretinal laser photocoagulation; IVK intravitreal kenalog; VMT vitreomacular traction; MH Macular hole;  NVD neovascularization of the disc; NVE neovascularization elsewhere; AREDS age related eye disease study; ARMD age related macular degeneration; POAG primary open angle glaucoma; EBMD epithelial/anterior basement membrane dystrophy; ACIOL anterior chamber intraocular lens; IOL intraocular lens; PCIOL posterior chamber intraocular lens; Phaco/IOL phacoemulsification with intraocular lens placement; PRK photorefractive keratectomy; LASIK laser assisted in situ keratomileusis; HTN hypertension; DM diabetes mellitus; COPD chronic obstructive pulmonary disease

## 2023-04-22 ENCOUNTER — Encounter (INDEPENDENT_AMBULATORY_CARE_PROVIDER_SITE_OTHER): Payer: Self-pay | Admitting: Ophthalmology

## 2023-04-22 ENCOUNTER — Ambulatory Visit (INDEPENDENT_AMBULATORY_CARE_PROVIDER_SITE_OTHER): Payer: Medicare Other | Admitting: Ophthalmology

## 2023-04-22 DIAGNOSIS — Z961 Presence of intraocular lens: Secondary | ICD-10-CM

## 2023-04-22 DIAGNOSIS — H353112 Nonexudative age-related macular degeneration, right eye, intermediate dry stage: Secondary | ICD-10-CM | POA: Diagnosis not present

## 2023-04-22 DIAGNOSIS — H353212 Exudative age-related macular degeneration, right eye, with inactive choroidal neovascularization: Secondary | ICD-10-CM | POA: Diagnosis not present

## 2023-04-22 DIAGNOSIS — H35033 Hypertensive retinopathy, bilateral: Secondary | ICD-10-CM | POA: Diagnosis not present

## 2023-04-22 DIAGNOSIS — I10 Essential (primary) hypertension: Secondary | ICD-10-CM

## 2023-04-22 DIAGNOSIS — H353211 Exudative age-related macular degeneration, right eye, with active choroidal neovascularization: Secondary | ICD-10-CM

## 2023-10-04 NOTE — Progress Notes (Signed)
 [facility] - Clinic Note  10/11/2023    CHIEF COMPLAINT Patient presents for Retina Follow Up   HISTORY OF PRESENT ILLNESS: Alyssa [name] is a 89 y.o. female who presents to the clinic today for:   HPI     Retina Follow Up   Patient presents with  Wet AMD.  In right eye.  This started 6 months ago.  I, the attending physician,  performed the HPI with the patient and updated documentation appropriately.        Comments   Patient here for 6 months retina follow up for exu ARMD OD. Patient states vision the same. No eye pain.       Last edited by Valdemar Rogue, MD on 10/11/2023 12:48 PM.     Pt states vision the same OU.  Referring physician: Jillian Lenis, MD No address on file  HISTORICAL INFORMATION:   Selected notes from the MEDICAL RECORD NUMBER Referred by Dr. FABIENE Gaudy -- pt had cataract surgery with him in the past, not actually a current pt LEE:  Ocular Hx- PMH-    CURRENT MEDICATIONS: Current Outpatient Medications (Ophthalmic Drugs)  Medication Sig   Hypromellose 0.4 % SOLN Apply 1 drop to eye daily as needed (for dry eyes). (Patient not taking: Reported on 10/11/2023)   No current facility-administered medications for this visit. (Ophthalmic Drugs)   Current Outpatient Medications (Other)  Medication Sig   enoxaparin  (LOVENOX ) 40 MG/0.4ML injection Inject 0.4 mLs (40 mg total) into the skin daily for 19 days.   HYDROcodone -acetaminophen  (NORCO/VICODIN) 5-325 MG tablet Take 1 tablet by mouth every 6 (six) hours as needed for moderate pain. (Patient not taking: Reported on 10/11/2023)   lisinopril (PRINIVIL,ZESTRIL) 10 MG tablet Take 5 mg by mouth as needed (for systolic blood pressure of greater than 160). (Patient not taking: Reported on 10/11/2023)   Multiple Vitamins-Minerals (PRESERVISION AREDS 2 PO) Take 1 capsule by mouth 2 (two) times daily. (Patient not taking: Reported on 10/11/2023)   No current facility-administered  medications for this visit. (Other)   REVIEW OF SYSTEMS: ROS   Positive for: Eyes Negative for: Constitutional, Gastrointestinal, Neurological, Skin, Genitourinary, Musculoskeletal, HENT, Endocrine, Cardiovascular, Respiratory, Psychiatric, Allergic/Imm, Heme/Lymph Last edited by Orval Asberry GORMAN, COA on 10/11/2023 10:11 AM.      ALLERGIES Allergies  Allergen Reactions   Penicillins Swelling    probably 1970; lips and tongue felt but didn't look swollen; dr told me not to take it again   Tape Other (See Comments)    Tears skin   PAST MEDICAL HISTORY Past Medical History:  Diagnosis Date   Arthritis    Cancer [facility])    female; not sure if uterus or endometrosis   Cough    occasional   Glaucoma, right eye    lens implant done for 4 to  yrs ago   High cholesterol    03/23/11 'don't take anything for it   Hypertension    Left bundle branch block    Stroke (HCC) 03/23/11   mini strokes; years ago; didn't go to dr;  Vinie had babies since then   Thyroid disease    S/P thyroidectomy   Tinnitus of both ears    Past Surgical History:  Procedure Laterality Date   ABDOMINAL HYSTERECTOMY  1970   APPENDECTOMY  ~ 1947   BREAST ENHANCEMENT SURGERY  ~ 1980   BREAST SURGERY     cadaver bone  2011   right small finger; took more  than cyst out too   CATARACT EXTRACTION W/ INTRAOCULAR LENS IMPLANT  ~ 2010   right   ENDOVENOUS ABLATION SAPHENOUS VEIN W/ LASER  04/14/2010   left; [facility]   EYE SURGERY     HIP PINNING,CANNULATED Right 02/05/2021   Procedure: CANNULATED HIP PINNING;  Surgeon: Barton Drape, MD;  Location: [facility];  Service: Orthopedics;  Laterality: Right;   INTRACAPSULAR CATARACT EXTRACTION  ~ 2010   left   Stab Phlebectomies   06/03/10   R leg; for varicose veins   THYROIDECTOMY  ~ 1970's   TONSILLECTOMY  1943   TOTAL HIP ARTHROPLASTY Left 04/06/2012   Procedure: TOTAL HIP ARTHROPLASTY;  Surgeon: Dempsey LULLA Moan, MD;  Location: [facility];  Service: Orthopedics;   Laterality: Left;   TUBAL LIGATION  yrs ago   FAMILY HISTORY History reviewed. No pertinent family history.  SOCIAL HISTORY Social History   Tobacco Use   Smoking status: Former    Current packs/day: 0.00    Types: Cigarettes    Quit date: 01/05/1970    Years since quitting: 53.8   Smokeless tobacco: Never   Tobacco comments:    03/23/11 'never really had habit of smoking; I have smoked a few cigarettes  Vaping Use   Vaping status: Never Used  Substance Use Topics   Alcohol use: No   Drug use: No       OPHTHALMIC EXAM:  Base Eye Exam     Visual Acuity (Snellen - Linear)       Right Left   Dist [place] 20/60 -2 20/40 -2   Dist ph  20/50 -2 20/30 -2         Tonometry (Tonopen, 10:09 AM)       Right Left   Pressure 15 15         Pupils       Dark Light Shape React APD   Right 3 2 Round Brisk None   Left 3 2 Round Brisk None         Visual Fields (Counting fingers)       Left Right    Full Full         Extraocular Movement       Right Left    Full, Ortho Full, Ortho         Neuro/Psych     Oriented x3: Yes   Mood/Affect: Normal         Dilation     Both eyes: 1.0% Mydriacyl, 2.5% Phenylephrine  @ 10:09 AM           Slit Lamp and Fundus Exam     External Exam       Right Left   External Normal Normal         Slit Lamp Exam       Right Left   Lids/Lashes Dermatochalasis - upper lid, Meibomian gland dysfunction Dermatochalasis - upper lid, Meibomian gland dysfunction   Conjunctiva/Sclera White and quiet White and quiet   Cornea Arcus, Well healed temporal cataract wound, trace fine Punctate epithelial erosions inferiorly 1-2+ fine inferior Punctate epithelial erosions, well healed cataract wound   Anterior Chamber Deep and clear, narrow temporal angle Deep and clear   Iris Round and dilated, patent [id number] Round and dilated, patent PI [id number]   Lens PC IOL toric in good position with marks at 1145 and 0545, trace Posterior  capsular opacification PC IOL in good position   Anterior Vitreous mild syneresis mild syneresis  Fundus Exam       Right Left   Disc mild Pallor, Sharp rim Mild Pallor, Sharp rim, Compact   C/D Ratio 0.2 0.2   Macula Flat, Blunted foveal reflex, Drusen, RPE mottling and clumping, central atrophy and fibrosis with mild pigment clumping, No heme or edema Flat, Blunted foveal reflex, Drusen, RPE mottling, clumping, and early atrophy, No heme or edema   Vessels Attenuated, Tortuous Attenuated, Tortuous, focal corkscrew vessel off ST arteriole, severe peripheral attenuation superior quad - ?old BRVO superiorly   Periphery Attached, mild Reticular degeneration, subretinal exudation and pigmented CR scarring temporal midzone, no heme Attached, No heme, mild Reticular degeneration           IMAGING AND PROCEDURES  Imaging and Procedures for 10/11/2023  OCT, Retina - OU - Both Eyes       Right Eye Quality was good. Central Foveal Thickness: 290. Progression has been stable. Findings include no IRF, no SRF, abnormal foveal contour, retinal drusen , pigment epithelial detachment, outer retinal atrophy (Central PED with overlying patchy ORA -- no fluid -- stable).   Left Eye Quality was good. Central Foveal Thickness: 279. Progression has been stable. Findings include normal foveal contour, no IRF, no SRF, retinal drusen , pigment epithelial detachment (Low lying PED -- no fluid).   Notes *Images captured and stored on drive  Diagnosis / Impression:  OD: Exu ARMD -- central low PED--no fluid, stable OS: Nonexudative ARMD *no IRF/SRF OU  Clinical management:  See below  Abbreviations: NFP - Normal foveal profile. CME - cystoid macular edema. PED - pigment epithelial detachment. IRF - intraretinal fluid. SRF - subretinal fluid. EZ - ellipsoid zone. ERM - epiretinal membrane. ORA - outer retinal atrophy. ORT - outer retinal tubulation. SRHM - subretinal hyper-reflective material.  IRHM - intraretinal hyper-reflective material            ASSESSMENT/PLAN:    ICD-10-CM   1. Exudative age-related macular degeneration of right eye with inactive choroidal neovascularization (HCC)  H35.3212 OCT, Retina - OU - Both Eyes    2. Intermediate stage nonexudative age-related macular degeneration of right eye  H35.3112     3. Essential hypertension  I10     4. Hypertensive retinopathy of both eyes  H35.033     5. Pseudophakia  Z96.1      1. Exudative age related macular degeneration, OD - s/p IVA OD #1 (06.15.23), #2 (09.06.23), #3 (11.28.23), #4 (02.20.24), #5 (05.28.24) - former pt of Dr. Vicky at [facility] -- pt lives in [place] -- history of q12wk IVA OD -- last injection at [facility] was IVA OD 3.2.23 - FA transit OD (07.27.23) shows +CNV OD; OS w/ early and late staining  - BCVA OD stable at 20/50, OS stable at 20/30 - OCT shows OD Central PED with overlying patchy ORA -- no fluid -- stable at 16 months since last injection - recommend holding injection again today - pt in agreement - Avastin  informed consent obtained and signed for OD 06.15.23  - f/u in 6-9 months -- DFE/OCT, possible injection  2. Age related macular degeneration, non-exudative, OS - The incidence, anatomy, and pathology of dry AMD, risk of progression, and the AREDS and AREDS 2 studies including smoking risks discussed with patient.  - Recommend amsler grid monitoring   3,4. Hypertensive retinopathy OU - discussed importance of tight BP control - FA 07.27.23 shows telangectasias and tortuous vessels along ST arcades -- ?old superior BRVO OS - no macular edema - monitor  5. Pseudophakia OU  - s/p CE/IOL OU (Dr. FABIENE Gaudy, unknown date)  - IOLs in good position, doing well  - monitor  Ophthalmic Meds Ordered this visit:  No orders of the defined types were placed in this encounter.    Return in about 9 months (around 07/10/2024) for f/u ARMD OU, Dilated Exam, OCT.  There are no Patient  Instructions on file for this visit.  Explained the diagnoses, plan, and follow up with the patient and they expressed understanding.  Patient expressed understanding of the importance of proper follow up care.   This document serves as a record of services personally performed by Redell JUDITHANN Hans, MD, PhD. It was created on their behalf by Avelina Pereyra, COA an ophthalmic technician. The creation of this record is the provider's dictation and/or activities during the visit.   Electronically signed by: Avelina GORMAN Pereyra, COT  10/11/23  12:49 PM   This document serves as a record of services personally performed by Redell JUDITHANN Hans, MD, PhD. It was created on their behalf by Wanda GEANNIE Keens, COT an ophthalmic technician. The creation of this record is the provider's dictation and/or activities during the visit.    Electronically signed by:  Wanda GEANNIE Keens, COT  10/11/23 12:49 PM  Redell JUDITHANN Hans, M.D., Ph.D. Diseases & Surgery of the Retina and Vitreous [facility]  I have reviewed the above documentation for accuracy and completeness, and I agree with the above. Redell JUDITHANN Hans, M.D., Ph.D. 10/11/23 12:51 PM   Abbreviations: M myopia (nearsighted); A astigmatism; H hyperopia (farsighted); P presbyopia; Mrx spectacle prescription;  CTL contact lenses; OD right eye; OS left eye; OU both eyes  XT exotropia; ET esotropia; PEK punctate epithelial keratitis; PEE punctate epithelial erosions; DES dry eye syndrome; MGD meibomian gland dysfunction; ATs artificial tears; PFAT's preservative free artificial tears; NSC nuclear sclerotic cataract; PSC posterior subcapsular cataract; ERM epi-retinal membrane; PVD posterior vitreous detachment; RD retinal detachment; DM diabetes mellitus; DR diabetic retinopathy; NPDR non-proliferative diabetic retinopathy; PDR proliferative diabetic retinopathy; CSME clinically significant macular edema; DME diabetic macular edema; dbh dot blot  hemorrhages; CWS cotton wool spot; POAG primary open angle glaucoma; C/D cup-to-disc ratio; HVF humphrey visual field; GVF goldmann visual field; OCT optical coherence tomography; IOP intraocular pressure; BRVO Branch retinal vein occlusion; CRVO central retinal vein occlusion; CRAO central retinal artery occlusion; BRAO branch retinal artery occlusion; RT retinal tear; SB scleral buckle; PPV pars plana vitrectomy; VH Vitreous hemorrhage; PRP panretinal laser photocoagulation; IVK intravitreal kenalog; VMT vitreomacular traction; MH Macular hole;  NVD neovascularization of the disc; NVE neovascularization elsewhere; AREDS age related eye disease study; ARMD age related macular degeneration; POAG primary open angle glaucoma; EBMD epithelial/anterior basement membrane dystrophy; ACIOL anterior chamber intraocular lens; IOL intraocular lens; PCIOL posterior chamber intraocular lens; Phaco/IOL phacoemulsification with intraocular lens placement; PRK photorefractive keratectomy; LASIK laser assisted in situ keratomileusis; HTN hypertension; DM diabetes mellitus; COPD chronic obstructive pulmonary disease

## 2023-10-11 ENCOUNTER — Ambulatory Visit (INDEPENDENT_AMBULATORY_CARE_PROVIDER_SITE_OTHER): Admitting: Ophthalmology

## 2023-10-11 ENCOUNTER — Encounter (INDEPENDENT_AMBULATORY_CARE_PROVIDER_SITE_OTHER): Payer: Self-pay | Admitting: Ophthalmology

## 2023-10-11 DIAGNOSIS — H35033 Hypertensive retinopathy, bilateral: Secondary | ICD-10-CM

## 2023-10-11 DIAGNOSIS — H353112 Nonexudative age-related macular degeneration, right eye, intermediate dry stage: Secondary | ICD-10-CM | POA: Diagnosis not present

## 2023-10-11 DIAGNOSIS — Z961 Presence of intraocular lens: Secondary | ICD-10-CM

## 2023-10-11 DIAGNOSIS — H353212 Exudative age-related macular degeneration, right eye, with inactive choroidal neovascularization: Secondary | ICD-10-CM

## 2023-10-11 DIAGNOSIS — I10 Essential (primary) hypertension: Secondary | ICD-10-CM

## 2023-10-22 ENCOUNTER — Encounter (INDEPENDENT_AMBULATORY_CARE_PROVIDER_SITE_OTHER): Admitting: Ophthalmology

## 2023-12-19 IMAGING — CT CT PELVIS W/O CM
2 of 4 series · 15 of 46 positions shown, 17 images · non-contrast
Comparison: 04/06/2012

CLINICAL DATA: Evaluate sclerotic lesion. Status post right hip
fracture

EXAM:
CT PELVIS WITHOUT CONTRAST
TECHNIQUE: Multidetector CT imaging of the pelvis was performed following the
standard protocol without intravenous contrast.
RADIATION DOSE REDUCTION: This exam was performed according to the
departmental dose-optimization program which includes automated
exposure control, adjustment of the mA and/or kV according to
patient size and/or use of iterative reconstruction technique.

[Series 3: axial st · axial · 0.83mm/px · z∈[+887,+1173]mm · 12 of 163 slices shown, 14 images]
[im 13/163  soft-tissue]
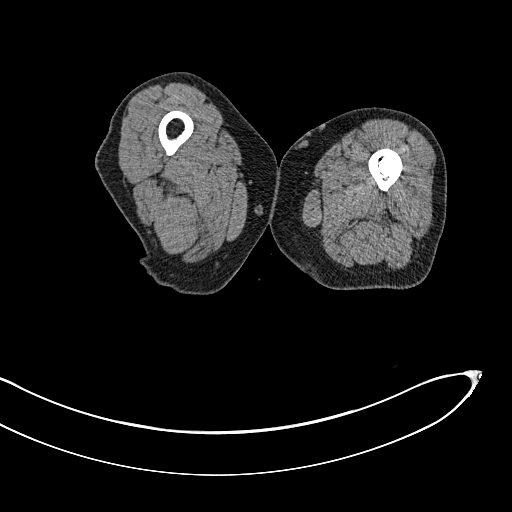
[im 13/163  bone]
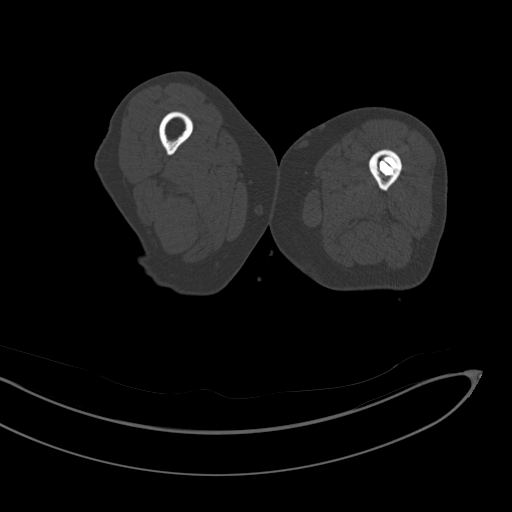
[im 26/163  soft-tissue]
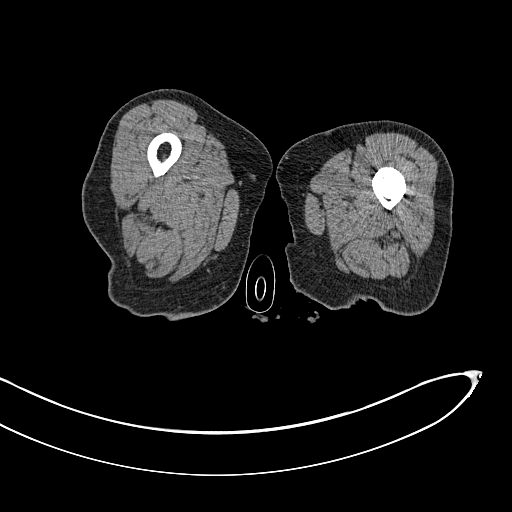
[im 39/163  soft-tissue]
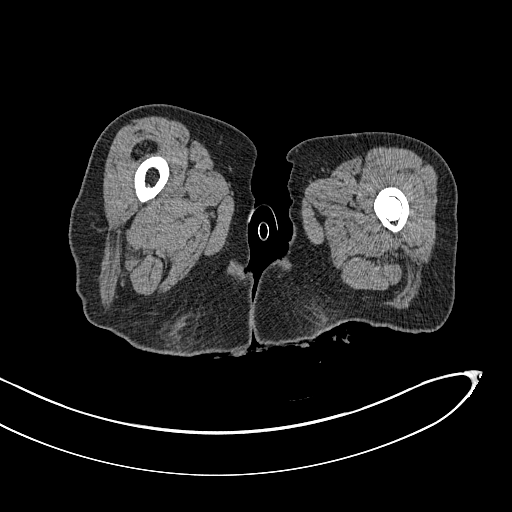
[im 52/163  soft-tissue]
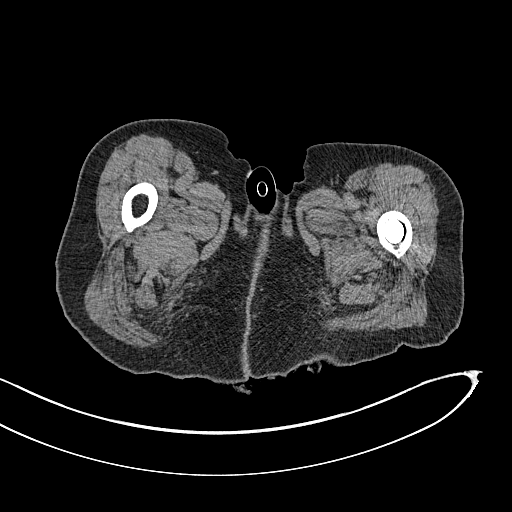
[im 65/163  soft-tissue]
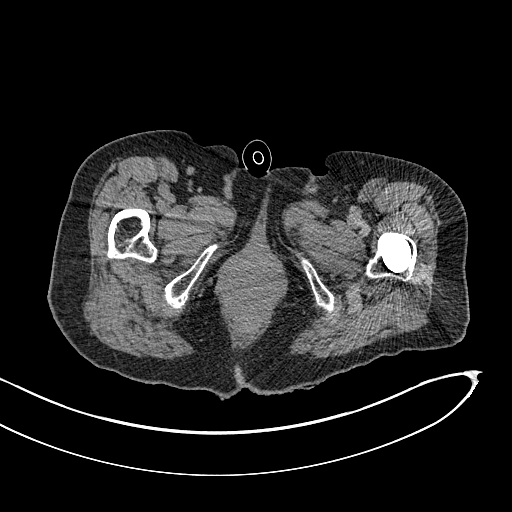
[im 78/163  soft-tissue]
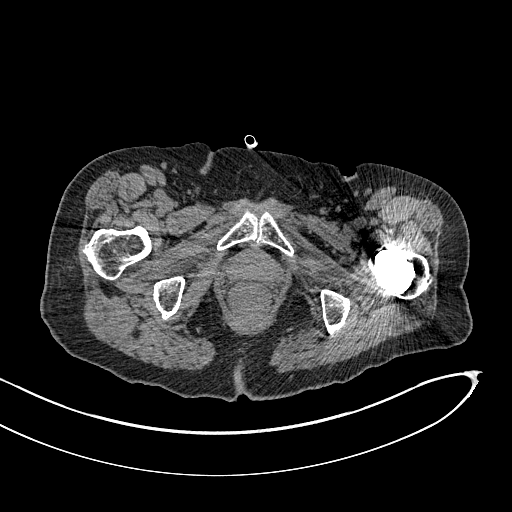
[im 91/163  soft-tissue]
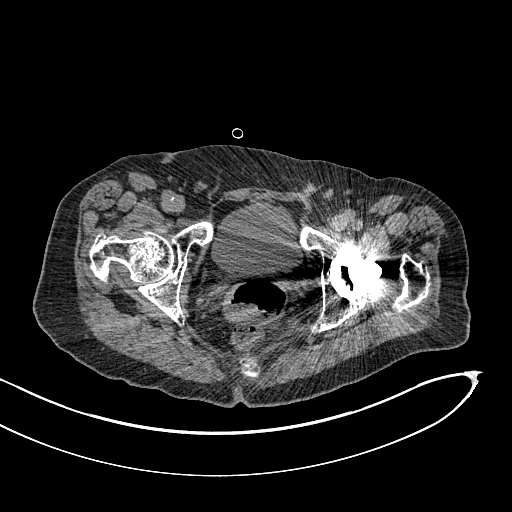
[im 104/163  soft-tissue]
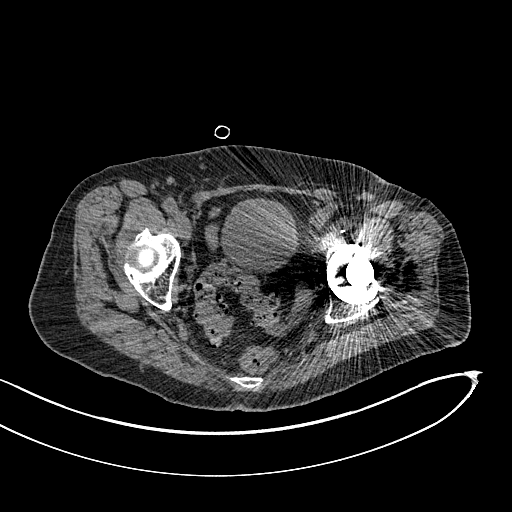
[im 117/163  soft-tissue]
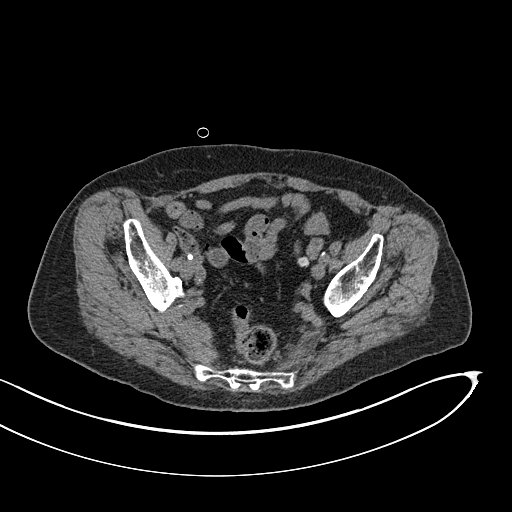
[im 117/163  bone]
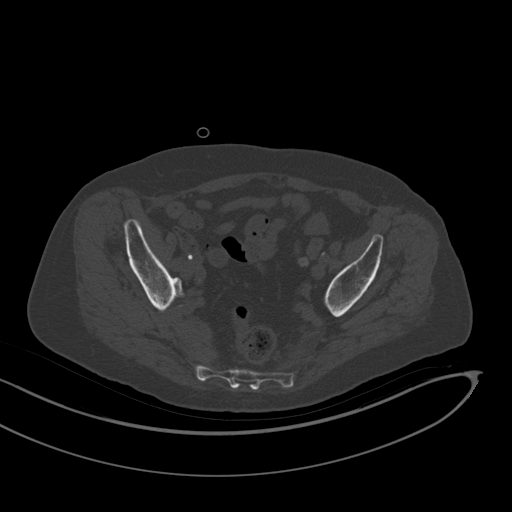
[im 130/163  soft-tissue]
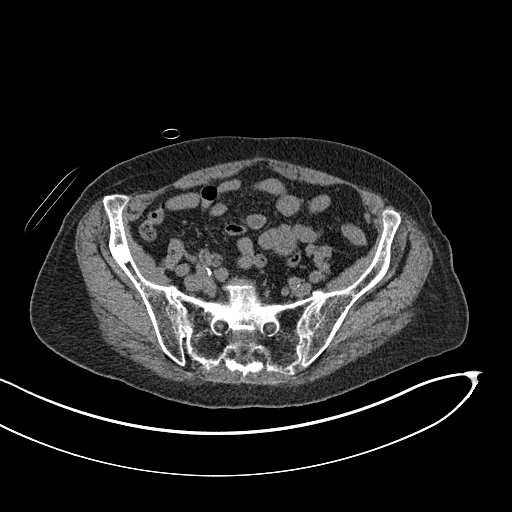
[im 143/163  soft-tissue]
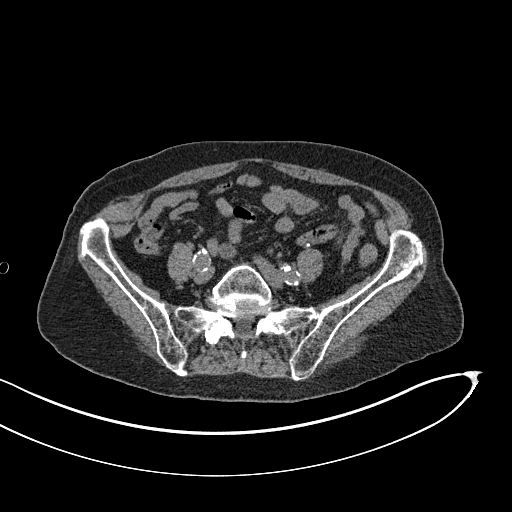
[im 156/163  soft-tissue]
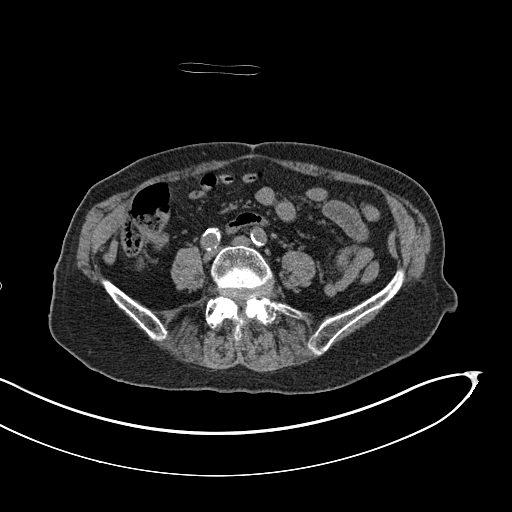

[Series 8: coronal st · coronal · 0.63mm/px · 3 of 127 slices shown]
[im 43/127  soft-tissue]
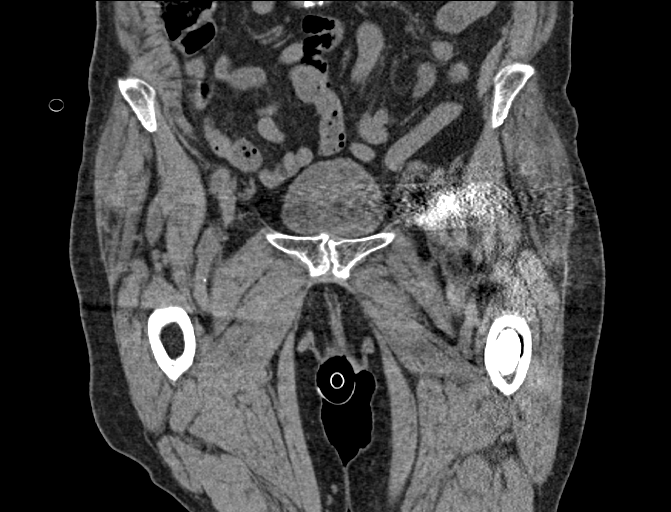
[im 57/127  soft-tissue]
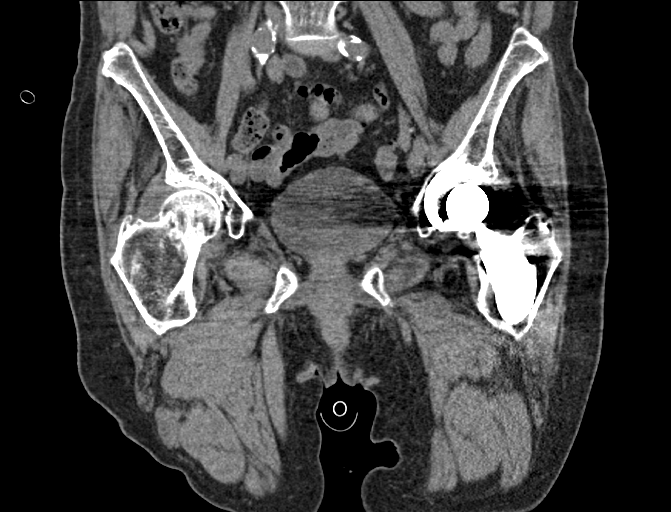
[im 71/127  soft-tissue]
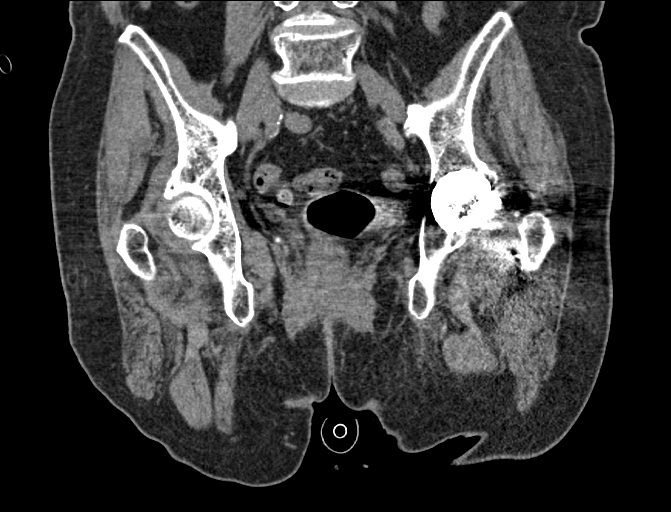

[15 of 46 positions shown; findings below may reference images not displayed]

FINDINGS: Urinary Tract:  No abnormality visualized.

Bowel: Sigmoid diverticulosis. No bowel wall thickening,
inflammation, or distension.

Vascular/Lymphatic: Extensive calcified atherosclerotic disease is
noted involving the iliac vessels. No aneurysm identified. No
enlarged lymph nodes.

Reproductive: The uterus appears surgically absent. No adnexal mass
identified

Other:  No free fluid or fluid collections identified.

Musculoskeletal: Status post left total hip arthroplasty. Hardware
components are in anatomic alignment. No signs of periprosthetic
fracture or dislocation.

Impacted subcapital right femoral neck fracture is identified. No
signs of dislocation.

Well-defined sclerotic lesion within the right iliac bone has a
narrow zone of transition measuring 1.6 cm in maximum dimension,
image 41/6. As noted on study from earlier today this has increased
in size compared with plain film radiograph dated 04/06/2012. On
04/06/2012 there is a 5 mm sclerotic lesion in this area. No
additional focal bone lesions identified.
IMPRESSION: 1. Impacted subcapital right femoral neck fracture.
2. Well-defined sclerotic lesion within the right iliac bone has
increased in size compared with plain film radiograph dated
04/06/2012. This is of doubtful acute clinical significance. In the
absence of a known malignancy this likely represents a benign bone
island. A nonemergent, outpatient nuclear medicine whole-body bone
scan may be considered to assess for uptake in this area as well as
assess for additional lesions.
3. Status post left total hip arthroplasty without signs of
periprosthetic fracture or dislocation.
4. Sigmoid diverticulosis without evidence for acute diverticulitis.
5. Aortic Atherosclerosis ([id number]).

## 2024-07-10 ENCOUNTER — Encounter (INDEPENDENT_AMBULATORY_CARE_PROVIDER_SITE_OTHER): Admitting: Ophthalmology
